# Patient Record
Sex: Female | Born: 1965 | Race: Black or African American | Hispanic: No | Marital: Married | Smoking: Never smoker
Health system: Southern US, Community
[De-identification: ages and names within clinical notes are randomized; demographics above are authoritative.]

## PROBLEM LIST (undated history)

## (undated) DIAGNOSIS — I10 Essential (primary) hypertension: Secondary | ICD-10-CM

## (undated) DIAGNOSIS — R600 Localized edema: Secondary | ICD-10-CM

## (undated) DIAGNOSIS — G4730 Sleep apnea, unspecified: Secondary | ICD-10-CM

## (undated) DIAGNOSIS — K529 Noninfective gastroenteritis and colitis, unspecified: Secondary | ICD-10-CM

## (undated) DIAGNOSIS — E559 Vitamin D deficiency, unspecified: Secondary | ICD-10-CM

## (undated) DIAGNOSIS — D869 Sarcoidosis, unspecified: Secondary | ICD-10-CM

## (undated) DIAGNOSIS — M25569 Pain in unspecified knee: Secondary | ICD-10-CM

## (undated) HISTORY — PX: MYOMECTOMY: SHX85

## (undated) HISTORY — DX: Sleep apnea, unspecified: G47.30

## (undated) HISTORY — PX: APPENDECTOMY: SHX54

## (undated) HISTORY — DX: Vitamin D deficiency, unspecified: E55.9

## (undated) HISTORY — DX: Pain in unspecified knee: M25.569

## (undated) HISTORY — DX: Localized edema: R60.0

## (undated) HISTORY — DX: Sarcoidosis, unspecified: D86.9

---

## 2005-02-26 DIAGNOSIS — I319 Disease of pericardium, unspecified: Secondary | ICD-10-CM

## 2005-02-26 HISTORY — DX: Disease of pericardium, unspecified: I31.9

## 2014-02-12 ENCOUNTER — Emergency Department (HOSPITAL_BASED_OUTPATIENT_CLINIC_OR_DEPARTMENT_OTHER)
Admission: EM | Admit: 2014-02-12 | Discharge: 2014-02-12 | Disposition: A | Discharge status: Home / Self Care | Payer: BC Managed Care – PPO | Attending: Emergency Medicine | Admitting: Emergency Medicine

## 2014-02-12 ENCOUNTER — Encounter (HOSPITAL_BASED_OUTPATIENT_CLINIC_OR_DEPARTMENT_OTHER): Payer: Self-pay | Admitting: *Deleted

## 2014-02-12 DIAGNOSIS — R109 Unspecified abdominal pain: Secondary | ICD-10-CM | POA: Diagnosis present

## 2014-02-12 DIAGNOSIS — Z3202 Encounter for pregnancy test, result negative: Secondary | ICD-10-CM | POA: Insufficient documentation

## 2014-02-12 DIAGNOSIS — Z9089 Acquired absence of other organs: Secondary | ICD-10-CM | POA: Insufficient documentation

## 2014-02-12 DIAGNOSIS — Z79899 Other long term (current) drug therapy: Secondary | ICD-10-CM | POA: Insufficient documentation

## 2014-02-12 DIAGNOSIS — K2970 Gastritis, unspecified, without bleeding: Secondary | ICD-10-CM | POA: Insufficient documentation

## 2014-02-12 DIAGNOSIS — I10 Essential (primary) hypertension: Secondary | ICD-10-CM | POA: Diagnosis not present

## 2014-02-12 HISTORY — DX: Essential (primary) hypertension: I10

## 2014-02-12 HISTORY — DX: Noninfective gastroenteritis and colitis, unspecified: K52.9

## 2014-02-12 LAB — URINALYSIS, ROUTINE W REFLEX MICROSCOPIC
Bilirubin Urine: NEGATIVE
Glucose, UA: NEGATIVE mg/dL
Hgb urine dipstick: NEGATIVE
Ketones, ur: NEGATIVE mg/dL
LEUKOCYTES UA: NEGATIVE
Nitrite: NEGATIVE
PH: 5.5 (ref 5.0–8.0)
Protein, ur: NEGATIVE mg/dL
SPECIFIC GRAVITY, URINE: 1.017 (ref 1.005–1.030)
Urobilinogen, UA: 0.2 mg/dL (ref 0.0–1.0)

## 2014-02-12 LAB — COMPREHENSIVE METABOLIC PANEL
ALBUMIN: 3.5 g/dL (ref 3.5–5.2)
ALT: 12 U/L (ref 0–35)
AST: 14 U/L (ref 0–37)
Alkaline Phosphatase: 57 U/L (ref 39–117)
Anion gap: 13 (ref 5–15)
BUN: 9 mg/dL (ref 6–23)
CALCIUM: 9.5 mg/dL (ref 8.4–10.5)
CHLORIDE: 105 meq/L (ref 96–112)
CO2: 24 mEq/L (ref 19–32)
CREATININE: 0.8 mg/dL (ref 0.50–1.10)
GFR calc Af Amer: >90 mL/min (ref >90)
GFR calc non Af Amer: 87 mL/min — ABNORMAL LOW (ref >90)
Glucose, Bld: 100 mg/dL — ABNORMAL HIGH (ref 70–99)
Potassium: 4.1 mEq/L (ref 3.7–5.3)
Sodium: 142 mEq/L (ref 137–147)
Total Bilirubin: 0.5 mg/dL (ref 0.3–1.2)
Total Protein: 7.8 g/dL (ref 6.0–8.3)

## 2014-02-12 LAB — CBC WITH DIFFERENTIAL/PLATELET
BASOS PCT: 0 % (ref 0–1)
Basophils Absolute: 0 10*3/uL (ref 0.0–0.1)
EOS PCT: 5 % (ref 0–5)
Eosinophils Absolute: 0.5 10*3/uL (ref 0.0–0.7)
HEMATOCRIT: 38.7 % (ref 36.0–46.0)
Hemoglobin: 13.1 g/dL (ref 12.0–15.0)
Lymphocytes Relative: 22 % (ref 12–46)
Lymphs Abs: 2.4 10*3/uL (ref 0.7–4.0)
MCH: 30.4 pg (ref 26.0–34.0)
MCHC: 33.9 g/dL (ref 30.0–36.0)
MCV: 89.8 fL (ref 78.0–100.0)
MONO ABS: 0.6 10*3/uL (ref 0.1–1.0)
Monocytes Relative: 5 % (ref 3–12)
NEUTROS ABS: 7.4 10*3/uL (ref 1.7–7.7)
Neutrophils Relative %: 68 % (ref 43–77)
Platelets: 307 10*3/uL (ref 150–400)
RBC: 4.31 MIL/uL (ref 3.87–5.11)
RDW: 12.6 % (ref 11.5–15.5)
WBC: 10.9 10*3/uL — AB (ref 4.0–10.5)

## 2014-02-12 LAB — LIPASE, BLOOD: LIPASE: 25 U/L (ref 11–59)

## 2014-02-12 LAB — PREGNANCY, URINE: Preg Test, Ur: NEGATIVE

## 2014-02-12 MED ORDER — ONDANSETRON HCL 4 MG/2ML IJ SOLN
4.0000 mg | Freq: Once | INTRAMUSCULAR | Status: AC
  Administered 2014-02-12: 4 mg via INTRAVENOUS
  Filled 2014-02-12: qty 2

## 2014-02-12 MED ORDER — ESOMEPRAZOLE MAGNESIUM 40 MG PO CPDR: 40.0000 mg | DELAYED_RELEASE_CAPSULE | Freq: Every day | ORAL | Status: DC

## 2014-02-12 MED ORDER — SODIUM CHLORIDE 0.9 % IV BOLUS (SEPSIS)
1000.0000 mL | Freq: Once | INTRAVENOUS | Status: AC
  Administered 2014-02-12: 1000 mL via INTRAVENOUS

## 2014-02-12 MED ORDER — ONDANSETRON 4 MG PO TBDP: ORAL_TABLET | ORAL | Status: DC

## 2014-02-12 NOTE — Discharge Instructions (Signed)

## 2014-02-12 NOTE — ED Provider Notes (Signed)
CSN: [id number]     Arrival date & time 02/12/14  1455 History   First MD Initiated Contact with Patient 02/12/14 1515     Chief Complaint  Patient presents with  . Abdominal Pain     (Consider location/radiation/quality/duration/timing/severity/associated sxs/prior Treatment) HPI Comments: Patient presents with nausea. She states that she started taking Augmentin about a week ago for sinus infection. Shortly after she started that she developed nausea. The antibiotic was switched to doxycycline but the nausea is continued. She's also started having some epigastric discomfort. She denies any vomiting. She denies any diarrhea or constipation. She denies any blood in her stool. She denies he fevers or chills. She states her sinus congestion has improved. She denies any cough or chest congestion. She denies any urinary symptoms.  Patient is a 48 y.o. female presenting with abdominal pain.  Abdominal Pain Associated symptoms: nausea   Associated symptoms: no chest pain, no chills, no cough, no diarrhea, no fatigue, no fever, no hematuria, no shortness of breath and no vomiting     Past Medical History  Diagnosis Date  . Colitis   . Hypertension    Past Surgical History  Procedure Laterality Date  . Appendectomy    . Cesarean section    . Myomectomy     No family history on file. History  Substance Use Topics  . Smoking status: Never Smoker   . Smokeless tobacco: Not on file  . Alcohol Use: No   OB History    No data available     Review of Systems  Constitutional: Negative for fever, chills, diaphoresis and fatigue.  HENT: Negative for congestion, rhinorrhea and sneezing.   Eyes: Negative.   Respiratory: Negative for cough, chest tightness and shortness of breath.   Cardiovascular: Negative for chest pain and leg swelling.  Gastrointestinal: Positive for nausea and abdominal pain. Negative for vomiting, diarrhea and blood in stool.  Genitourinary: Negative for frequency,  hematuria, flank pain and difficulty urinating.  Musculoskeletal: Negative for back pain and arthralgias.  Skin: Negative for rash.  Neurological: Negative for dizziness, speech difficulty, weakness, numbness and headaches.      Allergies  Review of patient's allergies indicates no known allergies.  Home Medications   Prior to Admission medications   Medication Sig Start Date End Date Taking? Authorizing Provider  amLODipine-benazepril (LOTREL) 5-20 MG per capsule Take 1 capsule by mouth daily.   Yes Historical Provider, MD  esomeprazole (NEXIUM) 40 MG capsule Take 1 capsule (40 mg total) by mouth daily. 02/12/14   Rolan BuccoMelanie Riyan Haile, MD  ondansetron (ZOFRAN ODT) 4 MG disintegrating tablet 4mg  ODT q4 hours prn nausea/vomit 02/12/14   Rolan BuccoMelanie Ramir Malerba, MD   BP 156/93 mmHg  Pulse 67  Temp(Src) 99.3 F (37.4 C) (Oral)  Resp 16  Ht 5\' 1"  (1.549 m)  Wt 189 lb (85.73 kg)  BMI 35.73 kg/m2  SpO2 100% Physical Exam  Constitutional: She is oriented to person, place, and time. She appears well-developed and well-nourished.  HENT:  Head: Normocephalic and atraumatic.  Eyes: Pupils are equal, round, and reactive to light.  Neck: Normal range of motion. Neck supple.  Cardiovascular: Normal rate, regular rhythm and normal heart sounds.   Pulmonary/Chest: Effort normal and breath sounds normal. No respiratory distress. She has no wheezes. She has no rales. She exhibits no tenderness.  Abdominal: Soft. Bowel sounds are normal. There is tenderness (mild tenderness to epigastrium). There is no rebound and no guarding.  Musculoskeletal: Normal range of motion. She exhibits  no edema.  Lymphadenopathy:    She has no cervical adenopathy.  Neurological: She is alert and oriented to person, place, and time.  Skin: Skin is warm and dry. No rash noted.  Psychiatric: She has a normal mood and affect.    ED Course  Procedures (including critical care time) Labs Review Labs Reviewed  CBC WITH DIFFERENTIAL  - Abnormal; Notable for the following:    WBC 10.9 (*)    All other components within normal limits  COMPREHENSIVE METABOLIC PANEL - Abnormal; Notable for the following:    Glucose, Bld 100 (*)    GFR calc non Af Amer 87 (*)    All other components within normal limits  LIPASE, BLOOD  URINALYSIS, ROUTINE W REFLEX MICROSCOPIC  PREGNANCY, URINE    Imaging Review No results found.   EKG Interpretation None      MDM   Final diagnoses:  Gastritis    Patient is feeling much better after IV fluids and Zofran. She has no abdominal pain on exam. Her labs are unremarkable. She's currently stop the antibiotics. I gave her prescription for Nexium and Zofran. I feel her symptoms are likely related to gastritis. She was encouraged to follow-up with her primary care physician or return here for symptoms worsen or are not improving.    Rolan BuccoMelanie Malcom Selmer, MD 02/12/14 [phone]

## 2014-02-12 NOTE — ED Notes (Signed)
Attempted to obtain urine sample, patient unable to provide any at this time.Patient states she hasn't had much to drink today.

## 2014-02-12 NOTE — ED Notes (Signed)
Generalized abdominal pain since Monday. She was started on antibiotics for sinus infection last week. Nausea. She vomited x 2 last night.

## 2015-04-02 DIAGNOSIS — E785 Hyperlipidemia, unspecified: Secondary | ICD-10-CM | POA: Insufficient documentation

## 2015-07-04 ENCOUNTER — Telehealth (HOSPITAL_COMMUNITY): Payer: Self-pay | Admitting: Vascular Surgery

## 2015-07-04 NOTE — Telephone Encounter (Signed)
Left pt message make f/u appt w/ DB

## 2015-08-12 ENCOUNTER — Ambulatory Visit (HOSPITAL_COMMUNITY)
Admission: RE | Admit: 2015-08-12 | Discharge: 2015-08-12 | Disposition: A | Discharge status: Home / Self Care | Payer: BC Managed Care – PPO | Source: Ambulatory Visit | Attending: Internal Medicine | Admitting: Internal Medicine

## 2015-08-12 ENCOUNTER — Encounter (HOSPITAL_COMMUNITY): Payer: Self-pay | Admitting: *Deleted

## 2015-08-12 VITALS — BP 132/88 | HR 76 | Wt 177.0 lb

## 2015-08-12 DIAGNOSIS — I10 Essential (primary) hypertension: Secondary | ICD-10-CM | POA: Diagnosis not present

## 2015-08-12 DIAGNOSIS — Z885 Allergy status to narcotic agent status: Secondary | ICD-10-CM | POA: Diagnosis not present

## 2015-08-12 DIAGNOSIS — I517 Cardiomegaly: Secondary | ICD-10-CM

## 2015-08-12 DIAGNOSIS — N979 Female infertility, unspecified: Secondary | ICD-10-CM | POA: Diagnosis not present

## 2015-08-12 DIAGNOSIS — K5190 Ulcerative colitis, unspecified, without complications: Secondary | ICD-10-CM | POA: Insufficient documentation

## 2015-08-12 NOTE — Progress Notes (Signed)
Patient ID: Christina LarocheMelody M Poole, female   DOB: 04/28/1965, 50 y.o.   MRN: [id number]  CARDIOLOGY CONSULT NOTE  Referring Physician: Dr. Foster SimpsonPaul Marshburn [facility] [place] [phone] Primary Cardiologist: Dr. 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 Dalene SeltzerMashburn for cardiac clearance prior to embryo implantation and pregnancy.   Underwent successful IVF 6 years ago without problems except with worsening HTN at end (denies pre-eclampsia). Now considering a second implant.  Denies h/o known cardiac disease. 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 Very active no CP or SOB. BP well controlled in 130s. No HF symptoms.    Review of Systems:     Cardiac Review of Systems: {Y] = yes [ ]  = no  Chest Pain [    ]  Resting SOB [   ] Exertional SOB  [  ]  Orthopnea [  ]   Pedal Edema [   ]    Palpitations [  ] Syncope  [  ]   Presyncope [   ]  General Review of Systems: [Y] = yes [  ]=no Constitional: recent weight change [  ]; anorexia [  ]; fatigue [  ]; nausea [  ]; night sweats [  ]; fever [  ]; or chills [  ];                                                                     Eyes : blurred vision [  ]; diplopia [   ]; vision changes [  ];  Amaurosis fugax[  ]; Resp: cough [  ];  wheezing[  ];  hemoptysis[  ];  PND [  ];  GI:  gallstones[  ], vomiting[  ];  dysphagia[  ]; melena[  ];  hematochezia [  ]; heartburn[  ];   GU: kidney stones [  ]; hematuria[  ];   dysuria [  ];  nocturia[  ]; incontinence [  ];             Skin: rash, swelling[  ];, hair loss[  ];  peripheral edema[  ];  or itching[  ]; Musculosketetal: myalgias[  ];  joint swelling[  ];  joint erythema[  ];  joint pain[  ];  back pain[  ];  Heme/Lymph: bruising[  ];  bleeding[  ];  anemia[  ];  Neuro: TIA[  ];  headaches[  ];  stroke[  ];  vertigo[  ];  seizures[   ];   paresthesias[  ];  difficulty walking[  ];  Psych:depression[  ]; anxiety[  ];  Endocrine: diabetes[  ];  thyroid dysfunction[  ];  Other:  Past Medical History  Diagnosis Date  . Colitis   . Hypertension   . Asthenospermia   . Pelvic adhesions     Prior to Admission medications   Medication Sig Start Date End Date Taking? Authorizing Provider  amLODipine-benazepril (LOTREL) 5-20 MG per capsule Take 1 capsule by mouth daily.   Yes Historical Provider, MD  Biotin 1 MG CAPS Take 1 mg elemental calcium/kg/hr by mouth daily.   Yes  Historical Provider, MD  clobetasol cream (TEMOVATE) 0.05 % Apply 1 application topically 2 (two) times daily.   Yes Historical Provider, MD  inFLIXimab (REMICADE) 100 MG injection Inject 100 mg into the vein every 8 (eight) weeks.   Yes Historical Provider, MD  Multiple Vitamin (MULTIVITAMIN) tablet Take 1 tablet by mouth daily.   Yes Historical Provider, MD  Omega-3 Fatty Acids (FISH OIL) 1200 MG CPDR Take 1,200 mg by mouth daily.   Yes Historical Provider, MD     Allergies  Allergen Reactions  . Codeine     Severe unrelenting nausea    Social History   Social History  . Marital Status: Married    Spouse Name: N/A  . Number of Children: N/A  . Years of Education: N/A   Occupational History  . Not on file.   Social History Main Topics  . Smoking status: Never Smoker   . Smokeless tobacco: Not on file  . Alcohol Use: No  . Drug Use: No  . Sexual Activity: Not on file   Other Topics Concern  . Not on file   Social History Narrative    Family history:  M 18  Diabetic no heart disease F died in 87s from cancer No FHx of premature CAD or HF    PHYSICAL EXAM: Filed Vitals:   08/12/15 1151  BP: 132/88  Pulse: 76    No intake or output data in the 24 hours ending 08/12/15 1233  General:  Well appearing. No respiratory difficulty HEENT: normal Neck: supple. no JVD. Carotids 2+ bilat; no bruits. No lymphadenopathy or  thryomegaly appreciated. Cor: PMI nondisplaced. Regular rate & rhythm. No rubs, gallops or murmurs. Lungs: clear Abdomen: soft, nontender, nondistended. No hepatosplenomegaly. No bruits or masses. Good bowel sounds. Extremities: no cyanosis, clubbing, rash, edema Neuro: alert & oriented x 3, cranial nerves grossly intact. moves all 4 extremities w/o difficulty. Affect pleasant.  ECG:  NSR 73 +LVH No ST-T wave abnormalities.    ASSESSMENT: 1. HTN 2. LVH on ECG 3. Infertility 4. Ulcerative colitis  PLAN/DISCUSSION:  Given her activity level and lack of CV risk factors or symptoms, I think she is fine to proceed with pregnancy. Given LVH on ECG will get echo for completeness sake. She will need to change her anti-HTN medicine from Lotrel to Aldomet prior to conception (Norvasc alone probably safe, if needed but Aldomet first choice). Watch BP closely during pregnancy. She can f/u with me as needed.  Bensimhon, Daniel,MD 12:44 PM

## 2015-08-12 NOTE — Patient Instructions (Signed)
Your physician recommends that you schedule a follow-up appointment AS NEEDED.  Your physician has requested that you have an echocardiogram. Echocardiography is a painless test that uses sound waves to create images of your heart. It provides your doctor with information about the size and shape of your heart and how well your heart's chambers and valves are working. This procedure takes approximately one hour. There are no restrictions for this procedure.      

## 2015-08-12 NOTE — Progress Notes (Signed)
Advanced Heart Failure Medication Review by a Pharmacist  Does the patient  feel that his/her medications are working for him/her?  yes  Has the patient been experiencing any side effects to the medications prescribed?  no  Does the patient measure his/her own blood pressure or blood glucose at home?  no   Does the patient have any problems obtaining medications due to transportation or finances?   no  Understanding of regimen: good Understanding of indications: good Potential of compliance: good Patient understands to avoid NSAIDs. Patient understands to avoid decongestants.   Pharmacist comments: MS. Christina Poole is a pleasant [age] yof presenting to clinic as a new HF patient. She is not currently experiencing any medication-related side effects. She denies any current swelling, SOB, or dizziness. She is taking very few prescription medications at this time. She states that she sleeps well at night and rarely misses any doses of her medications. She did not have any medication related questions or concerns at this visit. Made sure to establish current pharmacy.  Zakai Gonyea C. Marvis MoellerMiles, PharmD Pharmacy Resident  Pager: [phone] 08/12/2015 12:24 PM   Time with patient: 10 min  Preparation and documentation time: 2 min  Total time: 12 min

## 2015-08-13 DIAGNOSIS — I10 Essential (primary) hypertension: Secondary | ICD-10-CM | POA: Insufficient documentation

## 2015-08-13 DIAGNOSIS — I517 Cardiomegaly: Secondary | ICD-10-CM | POA: Insufficient documentation

## 2015-08-15 ENCOUNTER — Telehealth (HOSPITAL_COMMUNITY): Payer: Self-pay | Admitting: Cardiology

## 2015-08-15 NOTE — Telephone Encounter (Signed)
As requested by Dr Gala RomneyBensimhon oV 08/12/15 faxed to Referring Physician: Dr. Foster SimpsonPaul Marshburn [facility] [place]) [place] [phone]

## 2015-08-15 NOTE — Telephone Encounter (Signed)
-----   Message from Dolores Pattyaniel R Bensimhon, MD sent at 08/13/2015 12:31 AM EDT ----- Please fax to number on clinic note. Thanks.

## 2015-08-23 ENCOUNTER — Ambulatory Visit (HOSPITAL_COMMUNITY)
Admission: RE | Admit: 2015-08-23 | Discharge: 2015-08-23 | Disposition: A | Discharge status: Home / Self Care | Payer: BC Managed Care – PPO | Source: Ambulatory Visit | Attending: Cardiology | Admitting: Cardiology

## 2015-08-23 DIAGNOSIS — I110 Hypertensive heart disease with heart failure: Secondary | ICD-10-CM | POA: Diagnosis not present

## 2015-08-23 DIAGNOSIS — I340 Nonrheumatic mitral (valve) insufficiency: Secondary | ICD-10-CM | POA: Diagnosis not present

## 2015-08-23 DIAGNOSIS — I10 Essential (primary) hypertension: Secondary | ICD-10-CM | POA: Diagnosis not present

## 2015-08-23 DIAGNOSIS — I509 Heart failure, unspecified: Secondary | ICD-10-CM | POA: Diagnosis present

## 2015-08-23 LAB — ECHOCARDIOGRAM COMPLETE
E decel time: 229 msec
E/e' ratio: 8.92
FS: 31 % (ref 28–44)
IV/PV OW: 1.17
LA diam end sys: 37 mm
LA vol index: 30.7 mL/m2
LA vol: 54.9 mL
LADIAMINDEX: 2.07 cm/m2
LASIZE: 37 mm
LAVOLA4C: 52.2 mL
LV E/e' medial: 8.92
LV E/e'average: 8.92
LV TDI E'LATERAL: 9.57
LV e' LATERAL: 9.57 cm/s
LVOT area: 2.27 cm2
LVOT diameter: 17 mm
MV Dec: 229
MV Peak grad: 3 mmHg
MV pk A vel: 73.7 m/s
MVPKEVEL: 85.4 m/s
PW: 10.1 mm — AB (ref 0.6–1.1)
Reg peak vel: 214 cm/s
TAPSE: 26.1 mm
TDI e' medial: 8.92
TRMAXVEL: 214 cm/s

## 2015-08-23 NOTE — Progress Notes (Signed)
  Echocardiogram 2D Echocardiogram has been performed.  Arvil ChacoFoster, Deniz Eskridge 08/23/2015, 11:03 AM

## 2015-09-05 ENCOUNTER — Telehealth (HOSPITAL_COMMUNITY): Payer: Self-pay | Admitting: *Deleted

## 2015-09-05 NOTE — Telephone Encounter (Signed)
Pt given echo results, results also faxed to Dr. Foster SimpsonPaul Marshburn [facility] [place]) Fax [phone] per her request

## 2015-09-05 NOTE — Telephone Encounter (Signed)
Attempted to call pt w/echo results, Left message to call back   

## 2015-09-30 ENCOUNTER — Ambulatory Visit (INDEPENDENT_AMBULATORY_CARE_PROVIDER_SITE_OTHER): Payer: BC Managed Care – PPO | Admitting: Internal Medicine

## 2015-09-30 ENCOUNTER — Other Ambulatory Visit: Payer: BC Managed Care – PPO

## 2015-09-30 ENCOUNTER — Encounter: Payer: Self-pay | Admitting: Internal Medicine

## 2015-09-30 VITALS — BP 122/68 | HR 91 | Ht 61.0 in | Wt 177.0 lb

## 2015-09-30 DIAGNOSIS — L52 Erythema nodosum: Secondary | ICD-10-CM

## 2015-09-30 DIAGNOSIS — Z79899 Other long term (current) drug therapy: Secondary | ICD-10-CM

## 2015-09-30 DIAGNOSIS — Z5181 Encounter for therapeutic drug level monitoring: Secondary | ICD-10-CM | POA: Diagnosis not present

## 2015-09-30 DIAGNOSIS — D869 Sarcoidosis, unspecified: Secondary | ICD-10-CM

## 2015-09-30 DIAGNOSIS — R9389 Abnormal findings on diagnostic imaging of other specified body structures: Secondary | ICD-10-CM

## 2015-09-30 DIAGNOSIS — R938 Abnormal findings on diagnostic imaging of other specified body structures: Secondary | ICD-10-CM | POA: Diagnosis not present

## 2015-09-30 LAB — ANGIOTENSIN CONVERTING ENZYME: Angiotensin-Converting Enzyme: 28 U/L (ref 8–52)

## 2015-09-30 NOTE — Patient Instructions (Addendum)
ICD-9-CM ICD-10-CM   1. Sarcoidosis (HCC) 135 D86.9   2. 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 Abnormal chest x-ray 793.2 R93.8   4. Encounter for monitoring immunomodulating therapy V58.83 Z51.81    V58.69 Z79.899     Do blood ACE level  Do HRCT chest  And then ct chest with contrast Do PFT  Follolwup  return to see me next few weeks but after completing above

## 2015-09-30 NOTE — Progress Notes (Signed)
Subjective:    Patient ID: Christina Poole, female    DOB: 10-Apr-1965, 50 y.o.   MRN: [id number]  PCP No PCP Per Patient Gastro Dr. Dulce Sellar Dermatologist Dr. Arlys John sSTrauss    HPI   IOV 09/30/2015  Chief Complaint  Patient presents with  . Advice Only    Referred for abn cxr, sarcoidosis.  cxr in Epic. Denies any breathing complaints currently.      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 Christina Poole to [place] area approximately 2 years ago. She has inflammatory bowel disease and is under the care of Dr. Chrissie Noa outlaw at [facility] and his been on Remicade for the last several years every 2 months. She has not had a single flareup and being on Remicade. A year ago she started noticing skin lesions in bilateral shin that did not resolve. Therefore a few months ago she saw Dr. Arlys John Straussdermatologist was subjected to biopsy which confirmed sarcoidosis. For the last 3 weeks, after failing topical therapy, she's been on prednisone 5 mg every other day and this has helped the skin lesions. She's not having any side effects from the prednisone. There is concern that she might have pulmonary sarcoidosis because on 08/23/2015 she had a cardiac echocardiogram that was normal and a chest x-ray that is reported as abnormal. I personally visualized this chest x-ray and noticed hilar adenopathy and possible interstitial infiltrates. She says that these tests were done as part of workup related to her frozen embryos she did not want to detail any further. She says at baseline she walks 3 miles every few days and is not symptomatic. She denies any chest pain, cough, dyspnea, hemoptysis, night sweats, chills.   Echocardiogram 08/23/2015: Normal ejection fraction 65%. Right-sided pressures are normal. Pulmonary artery pressure is reported as normal.  02/12/2014 blood work: Creatinine 0.8 mg percent hemoglobin 13 g percent.    has a past medical history of Asthenospermia; Colitis;  Hypertension; Pelvic adhesions; and Sarcoidosis (HCC).   reports that she has never smoked. She has never used smokeless tobacco.  Past Surgical History:  Procedure Laterality Date  . APPENDECTOMY    . CESAREAN SECTION    . MYOMECTOMY      Allergies  Allergen Reactions  . Codeine     Severe unrelenting nausea    Immunization History  Administered Date(s) Administered  . Influenza Split 12/31/2014    No family history on file.   Current Outpatient Prescriptions:  .  amLODipine-benazepril (LOTREL) 5-20 MG per capsule, Take 1 capsule by mouth daily., Disp: , Rfl:  .  Biotin 1 MG CAPS, Take 1 mg elemental calcium/kg/hr by mouth daily., Disp: , Rfl:  .  inFLIXimab (REMICADE) 100 MG injection, Inject 100 mg into the vein every 8 (eight) weeks., Disp: , Rfl:  .  Multiple Vitamin (MULTIVITAMIN) tablet, Take 1 tablet by mouth daily., Disp: , Rfl:  .  Omega-3 Fatty Acids (FISH OIL) 1200 MG CPDR, Take 1,200 mg by mouth daily., Disp: , Rfl:  .  predniSONE (DELTASONE) 5 MG tablet, Take 5 mg by mouth every other day., Disp: , Rfl:  .  clobetasol cream (TEMOVATE) 0.05 %, Apply 1 application topically 2 (two) times daily., Disp: , Rfl:          Review of Systems  Constitutional: Negative for fever and unexpected weight change.  HENT: Negative for congestion, dental problem, ear pain, nosebleeds, postnasal drip, rhinorrhea, sinus pressure, sneezing, sore throat and trouble swallowing.   Eyes: Negative for redness  and itching.  Respiratory: Negative for cough, chest tightness, shortness of breath and wheezing.   Cardiovascular: Negative for palpitations and leg swelling.  Gastrointestinal: Negative for nausea and vomiting.  Genitourinary: Negative for dysuria.  Musculoskeletal: Negative for joint swelling.  Skin: Negative for rash.  Neurological: Negative for headaches.  Hematological: Does not bruise/bleed easily.  Psychiatric/Behavioral: Negative for dysphoric mood. The patient  is not nervous/anxious.        Objective:   Physical Exam  Constitutional: She is oriented to person, place, and time. She appears well-developed and well-nourished. No distress.  HENT:  Head: Normocephalic and atraumatic.  Right Ear: External ear normal.  Left Ear: External ear normal.  Mouth/Throat: Oropharynx is clear and moist. No oropharyngeal exudate.  Eyes: Conjunctivae and EOM are normal. Pupils are equal, round, and reactive to light. Right eye exhibits no discharge. Left eye exhibits no discharge. No scleral icterus.  Neck: Normal range of motion. Neck supple. No JVD present. No tracheal deviation present. No thyromegaly present.  Cardiovascular: Normal rate, regular rhythm, normal heart sounds and intact distal pulses.  Exam reveals no gallop and no friction rub.   No murmur heard. Pulmonary/Chest: Effort normal and breath sounds normal. No respiratory distress. She has no wheezes. She has no rales. She exhibits no tenderness.  Abdominal: Soft. Bowel sounds are normal. She exhibits no distension and no mass. There is no tenderness. There is no rebound and no guarding.  Musculoskeletal: Normal range of motion. She exhibits no edema or tenderness.  Lymphadenopathy:    She has no cervical adenopathy.  Neurological: She is alert and oriented to person, place, and time. She has normal reflexes. No cranial nerve deficit. She exhibits normal muscle tone. Coordination normal.  Skin: Skin is warm and dry. No rash noted. She is not diaphoretic. No erythema. No pallor.  Bilateral multiple thickened erythema nodosum in her shin. No ulcers  Psychiatric: She has a normal mood and affect. Her behavior is normal. Judgment and thought content normal.  Vitals reviewed.   Vitals:   09/30/15 1014  BP: 122/68  Pulse: 91  SpO2: 97%  Weight: 177 lb (80.3 kg)  Height: 5\' 1"  (1.549 m)         Assessment & Plan:     ICD-9-CM ICD-10-CM   1. Sarcoidosis (HCC) 135 D86.9 Angiotensin  converting enzyme     CT CHEST HIGH RESOLUTION     Pulmonary Function Test     CT CHEST W CONTRAST  2. 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 Abnormal chest x-ray 793.2 R93.8   4. Encounter for monitoring immunomodulating therapy V58.83 Z51.81    V58.69 Z79.899      She is on immunosuppressive therapy TNF Alpha blockade. 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 She has chest x-ray suggestive of pulmonary sarcoidosis but she is asymptomatic. At this point we will do a blood angiotensin converting enzyme level, pulmonary function tests and CT scan of the chest. Given the fact she is asymptomatic for pulmonary function test is normal then we would just continue to watch her on 5 mg prednisone every other day. However if the pulmonary function tests is significantly abnormal I would consider increasing her prednisone dosage. Given the fact she is asymptomatic and is already on TNF blockade I do not think there is a role for a second agent such as methotrexate.  Of note if the CT scan shows anything abnormal to suggest opportunistic infection and she might need a bronchoscopy   I will expand this to her in detail. She verbalized understanding and agreement  She is to return to see me in a few weeks after testing   Dr. Kalman Shan, M.D., [facility].C.P Pulmonary and Critical Care Medicine Staff Physician [facility] Pager: [phone], If no answer or between  15:00h - 7:00h: call [phone]  09/30/2015 10:49 AM

## 2015-10-03 ENCOUNTER — Ambulatory Visit (INDEPENDENT_AMBULATORY_CARE_PROVIDER_SITE_OTHER): Payer: BC Managed Care – PPO | Admitting: Internal Medicine

## 2015-10-03 DIAGNOSIS — D869 Sarcoidosis, unspecified: Secondary | ICD-10-CM | POA: Diagnosis not present

## 2015-10-03 LAB — PULMONARY FUNCTION TEST
DL/VA % pred: 113 %
DL/VA: 4.99 ml/min/mmHg/L
DLCO COR % PRED: 100 %
DLCO COR: 20.3 ml/min/mmHg
DLCO UNC % PRED: 102 %
DLCO UNC: 20.67 ml/min/mmHg
FEF 25-75 POST: 1.92 L/s
FEF 25-75 PRE: 2.08 L/s
FEF2575-%Change-Post: -7 %
FEF2575-%PRED-POST: 83 %
FEF2575-%PRED-PRE: 90 %
FEV1-%CHANGE-POST: -1 %
FEV1-%PRED-POST: 101 %
FEV1-%Pred-Pre: 102 %
FEV1-Post: 2.12 L
FEV1-Pre: 2.15 L
FEV1FVC-%Change-Post: -2 %
FEV1FVC-%Pred-Pre: 96 %
FEV6-%CHANGE-POST: 1 %
FEV6-%PRED-POST: 108 %
FEV6-%Pred-Pre: 106 %
FEV6-POST: 2.73 L
FEV6-Pre: 2.68 L
FEV6FVC-%Change-Post: 1 %
FEV6FVC-%PRED-POST: 103 %
FEV6FVC-%Pred-Pre: 102 %
FVC-%Change-Post: 0 %
FVC-%Pred-Post: 105 %
FVC-%Pred-Pre: 104 %
FVC-Post: 2.74 L
FVC-Pre: 2.71 L
POST FEV1/FVC RATIO: 77 %
PRE FEV1/FVC RATIO: 79 %
Post FEV6/FVC ratio: 100 %
Pre FEV6/FVC Ratio: 99 %
RV % pred: 76 %
RV: 1.22 L
TLC % pred: 88 %
TLC: 4.08 L

## 2015-10-03 NOTE — Progress Notes (Signed)
PFT performed today. 

## 2015-10-03 NOTE — Progress Notes (Signed)
LMOMTCB x 1 

## 2015-10-04 ENCOUNTER — Telehealth: Payer: Self-pay | Admitting: Internal Medicine

## 2015-10-04 ENCOUNTER — Ambulatory Visit (INDEPENDENT_AMBULATORY_CARE_PROVIDER_SITE_OTHER)
Admission: RE | Admit: 2015-10-04 | Discharge: 2015-10-04 | Disposition: A | Discharge status: Home / Self Care | Payer: BC Managed Care – PPO | Source: Ambulatory Visit | Attending: Internal Medicine | Admitting: Internal Medicine

## 2015-10-04 DIAGNOSIS — D869 Sarcoidosis, unspecified: Secondary | ICD-10-CM | POA: Diagnosis not present

## 2015-10-04 NOTE — Telephone Encounter (Signed)
Pt has been approved for HRCT chest without contrast and is scheduled for 10/04/2015 at 10:30 am. However, pt's insurance is needing a peer to peer for the CT chest with contrast.   Number to call: [phone] option 1, then option 1, then option 2.  MR please advise.

## 2015-10-05 NOTE — Telephone Encounter (Signed)
Patient notified of Dr. Jane Canaryamaswamy's recommendations. Patient would like to speak with Dr. Marchelle Gearingamaswamy before scheduling a Bronch.    Dr. Marchelle Gearingamaswamy, please contact patient at [phone] Thanks.

## 2015-10-05 NOTE — Telephone Encounter (Signed)
Baed on HRCT results -0 cancel CT chest with contrast  Let patient know that CT findings are c/w sarcoidpsis esp with age 2140s . However, given fact she has been on remicade for years probably best we do a bronchoscopy with lavage and bx. I can call her to go over it  Thanks  Dr. Kalman ShanMurali Christina Poole, M.D., [facility] Pulmonary and Critical Care Medicine Staff Physician [facility] [place] [facility] Pager: [phone], If no answer or between  15:00h - 7:00h: call [phone]  10/05/2015 2:30 AM      Ct Chest High Resolution  Result Date: 10/04/2015 CLINICAL DATA:  50 year old female with sarcoidosis (Reportedly biopsy-proven on lower extremity lesion biopsy). EXAM: CT CHEST WITHOUT CONTRAST TECHNIQUE: Multidetector CT imaging of the chest was performed following the standard protocol without intravenous contrast. High resolution imaging of the lungs, as well as inspiratory and expiratory imaging, was performed. COMPARISON:  None. FINDINGS: Mediastinum/Nodes: Normal heart size. No significant pericardial fluid/thickening. Great vessels are normal in course and caliber. No discrete thyroid nodules. Unremarkable esophagus. No axillary adenopathy. Mildly enlarged left prevascular lymph nodes measuring up to the 1.1 cm (series 4/ image 51). Mildly enlarged right paratracheal nodes measuring up to 1.4 cm (series 4/ image 51). Enlarged 1.6 cm subcarinal node (series 4/ image 63). Mild bilateral hilar lymphadenopathy, poorly delineated on this noncontrast CT. Lungs/Pleura: No pneumothorax. No pleural effusion. There is moderate patchy perilymphatic distribution nodularity throughout both lungs predominantly involving the mid to upper lungs, predominantly involving the peripheral peribronchovascular interstitium. There is mild perifissural nodularity along the bilateral major fissures. There are dominant conglomerate regions of nodularity in the subpleural basilar right upper lobe  measuring 2.8 x 1.4 cm (series 5/ image 64) and in the subpleural peripheral left upper lobe measuring 1.9 x 1.8 cm (series 5/image 80). No acute consolidative airspace disease. No significant regions of subpleural reticulation, traction bronchiectasis, ground-glass attenuation, parenchymal banding, architectural distortion or frank honeycombing. There is mild patchy air trapping in both lungs on the expiration sequence. Upper abdomen: Small hiatal hernia. Musculoskeletal: No aggressive appearing focal osseous lesions. Minimal thoracic spondylosis. IMPRESSION: 1. Interstitial lung disease characterized by moderate patchy perilymphatic distribution nodularity predominantly involving the mid to upper lungs, consistent with pulmonary sarcoidosis. 2. Mild to moderate mediastinal and mild bilateral hilar lymphadenopathy, consistent with sarcoidosis. 3. Mild patchy air trapping in both lungs, indicating small airways disease. 4. Small hiatal hernia. Electronically Signed   By: Delbert PhenixJason A Poff M.D.   On: 10/04/2015 13:07

## 2015-10-06 ENCOUNTER — Inpatient Hospital Stay: Admission: RE | Admit: 2015-10-06 | Payer: BC Managed Care – PPO | Source: Ambulatory Visit

## 2015-10-18 ENCOUNTER — Ambulatory Visit: Payer: BC Managed Care – PPO | Admitting: Internal Medicine

## 2015-10-20 NOTE — Telephone Encounter (Signed)
MR please advise if you've spoken to this patient about this.  Thanks!

## 2015-10-27 NOTE — Telephone Encounter (Signed)
Spoke with the pt and advised of recs per MR to come in 3 months after 8/31 and cancel appt 9/14  She verbalized understanding  She did not have her calendar and states will need to call us back about rov with PFT  I cancelled 11/10/15 appt

## 2015-10-27 NOTE — Telephone Encounter (Signed)
D/w patient:  - CT findings are typical of sarcoid - - Ddx is opportunistic infection due to TNFalpha blockade. she is asymptomatic and continues to be so. PFT normal  Based on above  - discussed monuitoring v bronch/bal/tbbx (low yield);' she opted to monitor without bronch  Plan  - cancel mid sept 2017 ov - change fu to 3 months from 10/27/2015  - get spiro and dlco (no lung volume, no bd response) in 3 months prior to next OV  Thanks  Dr. Kalman ShanMurali Darcel Frane, M.D., [facility] Pulmonary and Critical Care Medicine Staff Physician [facility] [place] [facility] Pager: [phone], If no answer or between  15:00h - 7:00h: call [phone]  10/27/2015 3:47 PM

## 2015-11-10 ENCOUNTER — Ambulatory Visit: Payer: BC Managed Care – PPO | Admitting: Internal Medicine

## 2016-07-16 ENCOUNTER — Telehealth: Payer: Self-pay | Admitting: Internal Medicine

## 2016-07-16 NOTE — Telephone Encounter (Signed)
Spoke with pt, who has requested that MR contact her current pulmonologist in [place], Dr. Sherryll BurgerShah. Dr. Margaretmary EddyShah's contact number is [phone].  Pt has already contact medical records for PFT & CT results. Pt would like to know if her breathing has worsen or is improving.  MR please advise. Thanks.

## 2016-07-18 NOTE — Telephone Encounter (Signed)
Ok it went to a call cnter and they are like "there are many Dr Fara BorosShahs In many location". So, please find out which Dr Sherryll BurgerShah it is and give him/her my number (cell) and I can talk  Dr. Kalman ShanMurali Renita Brocks, M.D., [facility] Pulmonary and Critical Care Medicine Staff Physician [facility] [place] [facility] Pager: [phone], If no answer or between  15:00h - 7:00h: call [phone]  07/18/2016 3:48 PM

## 2016-07-18 NOTE — Telephone Encounter (Signed)
Spoke with pt. Who states it is Dr. Erasmo DownerJoyti Shah. Pt states she thinks that Dr. Sherryll BurgerShah has received CT & PFT results from medical records, as Dr. Margaretmary EddyShah's office contacted pt today for an apt. Pt states she is going to contact Dr. Margaretmary EddyShah's office to confirm and will call back if MR still needs to reach out to Dr. Sherryll BurgerShah.

## 2016-09-07 ENCOUNTER — Other Ambulatory Visit (INDEPENDENT_AMBULATORY_CARE_PROVIDER_SITE_OTHER): Payer: BLUE CROSS/BLUE SHIELD

## 2016-09-07 ENCOUNTER — Encounter: Payer: Self-pay | Admitting: Adult Health

## 2016-09-07 ENCOUNTER — Ambulatory Visit (INDEPENDENT_AMBULATORY_CARE_PROVIDER_SITE_OTHER): Payer: BLUE CROSS/BLUE SHIELD | Admitting: Adult Health

## 2016-09-07 VITALS — BP 126/80 | HR 81 | Ht 61.0 in | Wt 191.0 lb

## 2016-09-07 DIAGNOSIS — D869 Sarcoidosis, unspecified: Secondary | ICD-10-CM

## 2016-09-07 LAB — HEPATIC FUNCTION PANEL
ALBUMIN: 3.8 g/dL (ref 3.5–5.2)
ALT: 16 U/L (ref 0–35)
AST: 15 U/L (ref 0–37)
Alkaline Phosphatase: 75 U/L (ref 39–117)
Bilirubin, Direct: 0 mg/dL (ref 0.0–0.3)
Total Bilirubin: 0.4 mg/dL (ref 0.2–1.2)
Total Protein: 7.3 g/dL (ref 6.0–8.3)

## 2016-09-07 LAB — BASIC METABOLIC PANEL
BUN: 17 mg/dL (ref 6–23)
CO2: 25 mEq/L (ref 19–32)
Calcium: 9.7 mg/dL (ref 8.4–10.5)
Chloride: 107 mEq/L (ref 96–112)
Creatinine, Ser: 0.99 mg/dL (ref 0.40–1.20)
GFR: 76.51 mL/min (ref >60.00)
Glucose, Bld: 106 mg/dL — ABNORMAL HIGH (ref 70–99)
Potassium: 4.2 mEq/L (ref 3.5–5.1)
Sodium: 140 mEq/L (ref 135–145)

## 2016-09-07 LAB — CBC WITH DIFFERENTIAL/PLATELET
Basophils Absolute: 0.1 10*3/uL (ref 0.0–0.1)
Basophils Relative: 0.7 % (ref 0.0–3.0)
Eosinophils Absolute: 0.2 10*3/uL (ref 0.0–0.7)
Eosinophils Relative: 1.3 % (ref 0.0–5.0)
HCT: 36.2 % (ref 36.0–46.0)
Hemoglobin: 11.9 g/dL — ABNORMAL LOW (ref 12.0–15.0)
LYMPHS PCT: 8.3 % — AB (ref 12.0–46.0)
Lymphs Abs: 1.2 10*3/uL (ref 0.7–4.0)
MCHC: 33 g/dL (ref 30.0–36.0)
MCV: 92.8 fl (ref 78.0–100.0)
MONOS PCT: 5 % (ref 3.0–12.0)
Monocytes Absolute: 0.7 10*3/uL (ref 0.1–1.0)
NEUTROS ABS: 11.9 10*3/uL — AB (ref 1.4–7.7)
Neutrophils Relative %: 84.7 % — ABNORMAL HIGH (ref 43.0–77.0)
Platelets: 357 10*3/uL (ref 150.0–400.0)
RBC: 3.9 Mil/uL (ref 3.87–5.11)
RDW: 15.8 % — ABNORMAL HIGH (ref 11.5–15.5)
WBC: 14.1 10*3/uL — ABNORMAL HIGH (ref 4.0–10.5)

## 2016-09-07 MED ORDER — METHOTREXATE SODIUM 2.5 MG PO TABS: ORAL_TABLET | ORAL | 5 refills | Status: DC

## 2016-09-07 NOTE — Patient Instructions (Signed)
Continue methotrexate weekly .  Continue on prednisone 10mg  daily for 3 weeks then alternate 10mg  .and 5mg  daily for 2 weeks. Then 5mg  daily and hold at this dose.  Labs today . Low sweet diet .  Establish with primary MD .  Follow up Dr. Marchelle Gearingamaswamy in 6 weeks and As needed   Please contact office for sooner follow up if symptoms do not improve or worsen or seek emergency care

## 2016-09-07 NOTE — Progress Notes (Signed)
@Patient  ID: Christina Poole, female    DOB: 20-Oct-1965, 51 y.o.   MRN: [id number]  Chief Complaint  Patient presents with  . Follow-up    [place]     Referring provider: No ref. provider found  HPI: 51 year old female never smoker followed for sarcoidosis (skin bx 2017 )  Hx of IBD -[facility] on Remicade   TEST  Echocardiogram 08/23/2015: Normal ejection fraction 65%. Right-sided pressures are normal. Pulmonary artery pressure is reported as normal. High resolution CT chest 10/04/2015 showed patchy nodularity involving the mid to upper lungs consistent with pulmonary sarcoidosis, interstitial lung disease., Mild to moderate mediastinal and bilateral hilar lymphadenopathy. Primary function test 10/03/2015 showed normal lung function with FEV1 at 101%, ratio 77, FVC 105%, no significant bronchodilator response, DLCO 102% ACE 28 09/2015    09/07/2016 Follow up : Sarcoid  Pt returns for 9 month follow up for Sarcoid  Moved to [place] briefly and seen by Pulmonary . She is getting copy of CT chest that was done.  Remains on Methotrexate (6 tabs /wk)  And Prednisone 10mg  daily  . She has skin and pulmonary involvement . Her Leg lesions are better but not gone.  No cough or dyspnea, hemoptysis .   Reports Calcium level was elevated and was given steroid burst over last month , now on 10mg  daily.   IBD is well controlled, has been taken off Remicade currently .    Allergies  Allergen Reactions  . Codeine     Severe unrelenting nausea    Immunization History  Administered Date(s) Administered  . Influenza Split 12/31/2014  . Influenza-Unspecified 12/03/2015    Past Medical History:  Diagnosis Date  . Asthenospermia   . Colitis   . Hypertension   . Pelvic adhesions   . Sarcoidosis     Tobacco History: History  Smoking Status  . Never Smoker  Smokeless Tobacco  . Never Used   Counseling given: Not Answered   Outpatient Encounter Prescriptions as of 09/07/2016    Medication Sig  . amLODipine-benazepril (LOTREL) 5-20 MG per capsule Take 1 capsule by mouth daily.  . Biotin 1 MG CAPS Take 1 mg elemental calcium/kg/hr by mouth daily.  . Cholecalciferol (VITAMIN D3) 2000 units TABS Take 1 capsule by mouth.  . inFLIXimab (REMICADE) 100 MG injection Inject 100 mg into the vein every 8 (eight) weeks.  . methotrexate 2.5 MG tablet Take 6 tabs every Friday  . Multiple Vitamins-Minerals (HAIR/SKIN/NAILS) CAPS Take 2 capsules by mouth daily.  . predniSONE (DELTASONE) 10 MG tablet Take 10 mg by mouth daily with breakfast.  . [DISCONTINUED] methotrexate 2.5 MG tablet Take 2.5 mg by mouth once a week. Take 6 tabs every Friday (Pt currently out of medicine)  . clobetasol cream (TEMOVATE) 0.05 % Apply 1 application topically 2 (two) times daily.  . [DISCONTINUED] Multiple Vitamin (MULTIVITAMIN) tablet Take 1 tablet by mouth daily.  . [DISCONTINUED] Omega-3 Fatty Acids (FISH OIL) 1200 MG CPDR Take 1,200 mg by mouth daily.  . [DISCONTINUED] predniSONE (DELTASONE) 5 MG tablet Take 5 mg by mouth every other day.   No facility-administered encounter medications on file as of 09/07/2016.      Review of Systems  Constitutional:   No  weight loss, night sweats,  Fevers, chills, fatigue, or  lassitude.  HEENT:   No headaches,  Difficulty swallowing,  Tooth/dental problems, or  Sore throat,                No sneezing,  itching, ear ache, nasal congestion, post nasal drip,   CV:  No chest pain,  Orthopnea, PND, swelling in lower extremities, anasarca, dizziness, palpitations, syncope.   GI  No heartburn, indigestion, abdominal pain, nausea, vomiting, diarrhea, change in bowel habits, loss of appetite, bloody stools.   Resp:    No chest wall deformity  Skin:  + skin lesions   GU: no dysuria, change in color of urine, no urgency or frequency.  No flank pain, no hematuria   MS:  No joint pain or swelling.  No decreased range of motion.  No back pain.    Physical  Exam  BP 126/80 (BP Location: Right Arm, Patient Position: Sitting, Cuff Size: Normal)   Pulse 81   Ht 5\' 1"  (1.549 m)   Wt 191 lb (86.6 kg)   SpO2 98%   BMI 36.09 kg/m   GEN: A/Ox3; pleasant , NAD,  Obese    HEENT:  [place]/AT,  EACs-clear, TMs-wnl, NOSE-clear, THROAT-clear, no lesions, no postnasal drip or exudate noted.   NECK:  Supple w/ fair ROM; no JVD; normal carotid impulses w/o bruits; no thyromegaly or nodules palpated; no lymphadenopathy.    RESP  Clear  P & A; w/o, wheezes/ rales/ or rhonchi. no accessory muscle use, no dullness to percussion  CARD:  RRR, no m/r/g, no peripheral edema, pulses intact, no cyanosis or clubbing.  GI:   Soft & nt; nml bowel sounds; no organomegaly or masses detected.   Musco: Warm bil, no deformities or joint swelling noted.   Neuro: alert, no focal deficits noted.    Skin: Warm, scattered macular lesions along lower legs.     Lab Results:  CBC    Component Value Date/Time   WBC 10.9 (H) 02/12/2014 1540   RBC 4.31 02/12/2014 1540   HGB 13.1 02/12/2014 1540   HCT 38.7 02/12/2014 1540   PLT 307 02/12/2014 1540   MCV 89.8 02/12/2014 1540   MCH 30.4 02/12/2014 1540   MCHC 33.9 02/12/2014 1540   RDW 12.6 02/12/2014 1540   LYMPHSABS 2.4 02/12/2014 1540   MONOABS 0.6 02/12/2014 1540   EOSABS 0.5 02/12/2014 1540   BASOSABS 0.0 02/12/2014 1540    BMET    Component Value Date/Time   NA 142 02/12/2014 1540   K 4.1 02/12/2014 1540   CL 105 02/12/2014 1540   CO2 24 02/12/2014 1540   GLUCOSE 100 (H) 02/12/2014 1540   BUN 9 02/12/2014 1540   CREATININE 0.80 02/12/2014 1540   CALCIUM 9.5 02/12/2014 1540   GFRNONAA 87 (L) 02/12/2014 1540   GFRAA >90 02/12/2014 1540    BNP No results found for: BNP  ProBNP No results found for: PROBNP  Imaging: No results found.   Assessment & Plan:   Sarcoidosis Appears stable , would like to lower prednisone as able  Steroid pt education  Recent hypercalcemia reported by pt , ?sarcoid  releated.  Will check level and taper steroids as able   Plan  Patient Instructions  Continue methotrexate weekly .  Continue on prednisone 10mg  daily for 3 weeks then alternate 10mg  .and 5mg  daily for 2 weeks. Then 5mg  daily and hold at this dose.  Labs today . Low sweet diet .  Establish with primary MD .  Follow up Dr. Marchelle Gearingamaswamy in 6 weeks and As needed   Please contact office for sooner follow up if symptoms do not improve or worsen or seek emergency care      Hypercalcemia Recent reported elevated levels  on steroid burst  Records requested   Plan  Check bmet  Patient Instructions  Continue methotrexate weekly .  Continue on prednisone 10mg  daily for 3 weeks then alternate 10mg  .and 5mg  daily for 2 weeks. Then 5mg  daily and hold at this dose.  Labs today . Low sweet diet .  Establish with primary MD .  Follow up Dr. Marchelle Gearing in 6 weeks and As needed   Please contact office for sooner follow up if symptoms do not improve or worsen or seek emergency care         Rubye Oaks, NP 09/07/2016

## 2016-09-07 NOTE — Progress Notes (Signed)
Left message for pt to call us back x1 

## 2016-09-07 NOTE — Assessment & Plan Note (Signed)
Recent reported elevated levels on steroid burst  Records requested   Plan  Check bmet  Patient Instructions  Continue methotrexate weekly .  Continue on prednisone 10mg  daily for 3 weeks then alternate 10mg  .and 5mg  daily for 2 weeks. Then 5mg  daily and hold at this dose.  Labs today . Low sweet diet .  Establish with primary MD .  Follow up Dr. Marchelle Gearingamaswamy in 6 weeks and As needed   Please contact office for sooner follow up if symptoms do not improve or worsen or seek emergency care

## 2016-09-07 NOTE — Assessment & Plan Note (Signed)
Appears stable , would like to lower prednisone as able  Steroid pt education  Recent hypercalcemia reported by pt , ?sarcoid releated.  Will check level and taper steroids as able   Plan  Patient Instructions  Continue methotrexate weekly .  Continue on prednisone 10mg  daily for 3 weeks then alternate 10mg  .and 5mg  daily for 2 weeks. Then 5mg  daily and hold at this dose.  Labs today . Low sweet diet .  Establish with primary MD .  Follow up Dr. Marchelle Gearingamaswamy in 6 weeks and As needed   Please contact office for sooner follow up if symptoms do not improve or worsen or seek emergency care

## 2016-09-10 NOTE — Progress Notes (Signed)
Spoke with patient and informed her of results. She was also informed to contact the office if she needed anything. Pt verbalized understanding. Nothing further is needed.

## 2016-09-17 ENCOUNTER — Ambulatory Visit (INDEPENDENT_AMBULATORY_CARE_PROVIDER_SITE_OTHER): Payer: BLUE CROSS/BLUE SHIELD | Admitting: Physician Assistant

## 2016-09-17 ENCOUNTER — Encounter: Payer: Self-pay | Admitting: Physician Assistant

## 2016-09-17 VITALS — BP 120/80 | HR 82 | Temp 98.2°F | Ht 61.0 in | Wt 193.0 lb

## 2016-09-17 DIAGNOSIS — I10 Essential (primary) hypertension: Secondary | ICD-10-CM | POA: Diagnosis not present

## 2016-09-17 DIAGNOSIS — Z13220 Encounter for screening for lipoid disorders: Secondary | ICD-10-CM | POA: Diagnosis not present

## 2016-09-17 DIAGNOSIS — G4733 Obstructive sleep apnea (adult) (pediatric): Secondary | ICD-10-CM

## 2016-09-17 DIAGNOSIS — Z136 Encounter for screening for cardiovascular disorders: Secondary | ICD-10-CM

## 2016-09-17 DIAGNOSIS — Z0000 Encounter for general adult medical examination without abnormal findings: Secondary | ICD-10-CM

## 2016-09-17 DIAGNOSIS — D869 Sarcoidosis, unspecified: Secondary | ICD-10-CM | POA: Diagnosis not present

## 2016-09-17 DIAGNOSIS — K529 Noninfective gastroenteritis and colitis, unspecified: Secondary | ICD-10-CM | POA: Diagnosis not present

## 2016-09-17 NOTE — Patient Instructions (Signed)
It was great to meet you!  Please make an appointment with the lab on your way out. I would like for you to return for lab work within 1 week. After midnight on the day of the lab draw, please do not eat anything. You may have water, black coffee, unsweetened tea.  Health Maintenance, Female Adopting a healthy lifestyle and getting preventive care can go a long way to promote health and wellness. Talk with your health care provider about what schedule of regular examinations is right for you. This is a good chance for you to check in with your provider about disease prevention and staying healthy. In between checkups, there are plenty of things you can do on your own. Experts have done a lot of research about which lifestyle changes and preventive measures are most likely to keep you healthy. Ask your health care provider for more information. Weight and diet Eat a healthy diet  Be sure to include plenty of vegetables, fruits, low-fat dairy products, and lean protein.  Do not eat a lot of foods high in solid fats, added sugars, or salt.  Get regular exercise. This is one of the most important things you can do for your health. ? Most adults should exercise for at least 150 minutes each week. The exercise should increase your heart rate and make you sweat (moderate-intensity exercise). ? Most adults should also do strengthening exercises at least twice a week. This is in addition to the moderate-intensity exercise.  Maintain a healthy weight  Body mass index (BMI) is a measurement that can be used to identify possible weight problems. It estimates body fat based on height and weight. Your health care provider can help determine your BMI and help you achieve or maintain a healthy weight.  For females 47 years of age and older: ? A BMI below 18.5 is considered underweight. ? A BMI of 18.5 to 24.9 is normal. ? A BMI of 25 to 29.9 is considered overweight. ? A BMI of 30 and above is considered  obese.  Watch levels of cholesterol and blood lipids  You should start having your blood tested for lipids and cholesterol at 49 years of age, then have this test every 5 years.  You may need to have your cholesterol levels checked more often if: ? Your lipid or cholesterol levels are high. ? You are older than 49 years of age. ? You are at high risk for heart disease.  Cancer screening Lung Cancer  Lung cancer screening is recommended for adults 42-22 years old who are at high risk for lung cancer because of a history of smoking.  A yearly low-dose CT scan of the lungs is recommended for people who: ? Currently smoke. ? Have quit within the past 15 years. ? Have at least a 30-pack-year history of smoking. A pack year is smoking an average of one pack of cigarettes a day for 1 year.  Yearly screening should continue until it has been 15 years since you quit.  Yearly screening should stop if you develop a health problem that would prevent you from having lung cancer treatment.  Breast Cancer  Practice breast self-awareness. This means understanding how your breasts normally appear and feel.  It also means doing regular breast self-exams. Let your health care provider know about any changes, no matter how small.  If you are in your 20s or 30s, you should have a clinical breast exam (CBE) by a health care provider every 1-3 years  as part of a regular health exam.  If you are 54 or older, have a CBE every year. Also consider having a breast X-ray (mammogram) every year.  If you have a family history of breast cancer, talk to your health care provider about genetic screening.  If you are at high risk for breast cancer, talk to your health care provider about having an MRI and a mammogram every year.  Breast cancer gene (BRCA) assessment is recommended for women who have family members with BRCA-related cancers. BRCA-related cancers  include: ? Breast. ? Ovarian. ? Tubal. ? Peritoneal cancers.  Results of the assessment will determine the need for genetic counseling and BRCA1 and BRCA2 testing.  Cervical Cancer Your health care provider may recommend that you be screened regularly for cancer of the pelvic organs (ovaries, uterus, and vagina). This screening involves a pelvic examination, including checking for microscopic changes to the surface of your cervix (Pap test). You may be encouraged to have this screening done every 3 years, beginning at age 69.  For women ages 40-65, health care providers may recommend pelvic exams and Pap testing every 3 years, or they may recommend the Pap and pelvic exam, combined with testing for human papilloma virus (HPV), every 5 years. Some types of HPV increase your risk of cervical cancer. Testing for HPV may also be done on women of any age with unclear Pap test results.  Other health care providers may not recommend any screening for nonpregnant women who are considered low risk for pelvic cancer and who do not have symptoms. Ask your health care provider if a screening pelvic exam is right for you.  If you have had past treatment for cervical cancer or a condition that could lead to cancer, you need Pap tests and screening for cancer for at least 20 years after your treatment. If Pap tests have been discontinued, your risk factors (such as having a new sexual partner) need to be reassessed to determine if screening should resume. Some women have medical problems that increase the chance of getting cervical cancer. In these cases, your health care provider may recommend more frequent screening and Pap tests.  Colorectal Cancer  This type of cancer can be detected and often prevented.  Routine colorectal cancer screening usually begins at 49 years of age and continues through 49 years of age.  Your health care provider may recommend screening at an earlier age if you have risk factors  for colon cancer.  Your health care provider may also recommend using home test kits to check for hidden blood in the stool.  A small camera at the end of a tube can be used to examine your colon directly (sigmoidoscopy or colonoscopy). This is done to check for the earliest forms of colorectal cancer.  Routine screening usually begins at age 60.  Direct examination of the colon should be repeated every 5-10 years through 49 years of age. However, you may need to be screened more often if early forms of precancerous polyps or small growths are found.  Skin Cancer  Check your skin from head to toe regularly.  Tell your health care provider about any new moles or changes in moles, especially if there is a change in a mole's shape or color.  Also tell your health care provider if you have a mole that is larger than the size of a pencil eraser.  Always use sunscreen. Apply sunscreen liberally and repeatedly throughout the day.  Protect yourself by  wearing long sleeves, pants, a wide-brimmed hat, and sunglasses whenever you are outside.  Heart disease, diabetes, and high blood pressure  High blood pressure causes heart disease and increases the risk of stroke. High blood pressure is more likely to develop in: ? People who have blood pressure in the high end of the normal range (130-139/85-89 mm Hg). ? People who are overweight or obese. ? People who are African American.  If you are 48-51 years of age, have your blood pressure checked every 3-5 years. If you are 17 years of age or older, have your blood pressure checked every year. You should have your blood pressure measured twice-once when you are at a hospital or clinic, and once when you are not at a hospital or clinic. Record the average of the two measurements. To check your blood pressure when you are not at a hospital or clinic, you can use: ? An automated blood pressure machine at a pharmacy. ? A home blood pressure monitor.  If  you are between 67 years and 58 years old, ask your health care provider if you should take aspirin to prevent strokes.  Have regular diabetes screenings. This involves taking a blood sample to check your fasting blood sugar level. ? If you are at a normal weight and have a low risk for diabetes, have this test once every three years after 49 years of age. ? If you are overweight and have a high risk for diabetes, consider being tested at a younger age or more often. Preventing infection Hepatitis B  If you have a higher risk for hepatitis B, you should be screened for this virus. You are considered at high risk for hepatitis B if: ? You were born in a country where hepatitis B is common. Ask your health care provider which countries are considered high risk. ? Your parents were born in a high-risk country, and you have not been immunized against hepatitis B (hepatitis B vaccine). ? You have HIV or AIDS. ? You use needles to inject street drugs. ? You live with someone who has hepatitis B. ? You have had sex with someone who has hepatitis B. ? You get hemodialysis treatment. ? You take certain medicines for conditions, including cancer, organ transplantation, and autoimmune conditions.  Hepatitis C  Blood testing is recommended for: ? Everyone born from [birth year] through 1963. ? Anyone with known risk factors for hepatitis C.  Sexually transmitted infections (STIs)  You should be screened for sexually transmitted infections (STIs) including gonorrhea and chlamydia if: ? You are sexually active and are younger than 49 years of age. ? You are older than 49 years of age and your health care provider tells you that you are at risk for this type of infection. ? Your sexual activity has changed since you were last screened and you are at an increased risk for chlamydia or gonorrhea. Ask your health care provider if you are at risk.  If you do not have HIV, but are at risk, it may be recommended  that you take a prescription medicine daily to prevent HIV infection. This is called pre-exposure prophylaxis (PrEP). You are considered at risk if: ? You are sexually active and do not regularly use condoms or know the HIV status of your partner(s). ? You take drugs by injection. ? You are sexually active with a partner who has HIV.  Talk with your health care provider about whether you are at high risk of being infected with  HIV. If you choose to begin PrEP, you should first be tested for HIV. You should then be tested every 3 months for as long as you are taking PrEP. Pregnancy  If you are premenopausal and you may become pregnant, ask your health care provider about preconception counseling.  If you may become pregnant, take 400 to 800 micrograms (mcg) of folic acid every day.  If you want to prevent pregnancy, talk to your health care provider about birth control (contraception). Osteoporosis and menopause  Osteoporosis is a disease in which the bones lose minerals and strength with aging. This can result in serious bone fractures. Your risk for osteoporosis can be identified using a bone density scan.  If you are 21 years of age or older, or if you are at risk for osteoporosis and fractures, ask your health care provider if you should be screened.  Ask your health care provider whether you should take a calcium or vitamin D supplement to lower your risk for osteoporosis.  Menopause may have certain physical symptoms and risks.  Hormone replacement therapy may reduce some of these symptoms and risks. Talk to your health care provider about whether hormone replacement therapy is right for you. Follow these instructions at home:  Schedule regular health, dental, and eye exams.  Stay current with your immunizations.  Do not use any tobacco products including cigarettes, chewing tobacco, or electronic cigarettes.  If you are pregnant, do not drink alcohol.  If you are  breastfeeding, limit how much and how often you drink alcohol.  Limit alcohol intake to no more than 1 drink per day for nonpregnant women. One drink equals 12 ounces of beer, 5 ounces of wine, or 1 ounces of hard liquor.  Do not use street drugs.  Do not share needles.  Ask your health care provider for help if you need support or information about quitting drugs.  Tell your health care provider if you often feel depressed.  Tell your health care provider if you have ever been abused or do not feel safe at home. This information is not intended to replace advice given to you by your health care provider. Make sure you discuss any questions you have with your health care provider. Document Released: 08/28/2010 Document Revised: 07/21/2015 Document Reviewed: 11/16/2014 Elsevier Interactive Patient Education  [employer].

## 2016-09-17 NOTE — Assessment & Plan Note (Signed)
Followed by Dr[facility]utlaw [facility]. Was previously on Remicade. Most recent colonoscopy in May 2018. She is considering changing to [facility], she will let us know what she decides.

## 2016-09-17 NOTE — Assessment & Plan Note (Signed)
Patient remains on methotrexate 6 tabs/wk and prednisone 10 mg daily, with goal to get to 5 mg daily. Followed by [name] and Dr. Marchelle Gearingamaswamy.

## 2016-09-17 NOTE — Assessment & Plan Note (Signed)
Currently without CPAP. Has not had a sleep study in several years but is interested in using CPAP again. Will message her pulmonologist to see if this can be addressed by them.

## 2016-09-17 NOTE — Progress Notes (Signed)
Christina Poole is a 51 y.o. female here to Establish Care and Annual physical exam.  I acted as a Neurosurgeon for [employer], PA-C Corky Mull, LPN  History of Present Illness:   Chief Complaint  Patient presents with  . Establish Care    BC/BS  . Annual Exam    Acute Concerns: None  Chronic Issues: Essential HTN -- currently on amlodopine-benazapril 5-20 mg and has been on this medication for several years; does not have any no chest pain, SOB, LE swelling, blurred vision. Does not check blood pressures at home regularly. BMP completed 10 days ago, normal K and renal function.  Sleep apnea -- diagnosed sleep apnea in the past and used a CPAP for about 1 year, has not used her CPAP in several years but is interested in re-testing for this Colitis -- sees Dr. Dulce Sellar and has been on Remicade in the past. Most recent colonoscopy this past May and was normal.  Sarcoidosis -- sees Dr. Marchelle Gearing and Rubye Oaks, NP with [facility]. Patient remains on methotrexate 6 tabs/wk and prednisone 10 mg daily. Per most recent cardiology note: Echocardiogram 08/23/2015: Normal ejection fraction 65%. Right-sided pressures are normal. Pulmonary artery pressure is reported as normal. High resolution CT chest 10/04/2015 showed patchy nodularity involving the mid to upper lungs consistent with pulmonary sarcoidosis, interstitial lung disease., Mild to moderate mediastinal and bilateral hilar lymphadenopathy. Primary function test 10/03/2015 showed normal lung function with FEV1 at 101%, ratio 77, FVC 105%, no significant bronchodilator response, DLCO 102% ACE 28 09/2015   Health Maintenance: Immunizations --  Requesting records Colonoscopy -- yearly, last done this year Mammogram -- no hx of abnormal mammogram PAP -- no recent abnormal paps Bone Density -- most recently done several years ago Diet -- eat all food groups, eats out a lot, drinks water and unsweetened  Caffeine intake --  coffee in the AM Sleep habits -- not good, prednisone daily helps, 4-5 hours daily Exercise -- starting to increase exercise Weight -- Weight: 193 lb (87.5 kg) -- high for her, more comfortable around 150 lb Mood -- no issues of anxiety  Depression screen PHQ 2/9 09/17/2016  Decreased Interest 0  Down, Depressed, Hopeless 0  PHQ - 2 Score 0    No flowsheet data found.  Other providers/specialists: Dr. Marchelle Gearing -- sarcoidosis Dr. Dulce Sellar -- colitis [facility]   Past Medical History:  Diagnosis Date  . Colitis   . Sarcoidosis      Social History   Social History  . Marital status: Married    Spouse name: N/A  . Number of children: N/A  . Years of education: N/A   Occupational History  . Not on file.   Social History Main Topics  . Smoking status: Never Smoker  . Smokeless tobacco: Never Used  . Alcohol use No  . Drug use: No  . Sexual activity: Yes    Birth control/ protection: None   Other Topics Concern  . Not on file   Social History Narrative   From [place], went to [employer]    Married, two children, twins    Past Surgical History:  Procedure Laterality Date  . APPENDECTOMY    . CESAREAN SECTION    . MYOMECTOMY      Family History  Problem Relation Age of Onset  . Colon cancer Father        in his 28s    Allergies  Allergen Reactions  . Codeine  Severe unrelenting nausea     Current Medications:   Current Outpatient Prescriptions:  .  amLODipine-benazepril (LOTREL) 5-20 MG per capsule, Take 1 capsule by mouth daily., Disp: , Rfl:  .  Cholecalciferol (VITAMIN D3) 2000 units TABS, Take 1 capsule by mouth., Disp: , Rfl:  .  clobetasol cream (TEMOVATE) 0.05 %, Apply 1 application topically 2 (two) times daily., Disp: , Rfl:  .  folic acid (FOLVITE) 1 MG tablet, Take 1 mg by mouth daily., Disp: , Rfl:  .  methotrexate 2.5 MG tablet, Take 6 tabs every Friday, Disp: 30 tablet, Rfl: 5 .  Multiple Vitamins-Minerals  (HAIR/SKIN/NAILS) CAPS, Take 2 capsules by mouth daily., Disp: , Rfl:  .  predniSONE (DELTASONE) 10 MG tablet, Take 10 mg by mouth daily with breakfast., Disp: , Rfl:  .  inFLIXimab (REMICADE) 100 MG injection, Inject 100 mg into the vein every 8 (eight) weeks., Disp: , Rfl:    Review of Systems:   Review of Systems  Constitutional: Negative for chills, fever, malaise/fatigue and weight loss.  HENT: Negative for hearing loss, sinus pain and sore throat.   Eyes: Negative for blurred vision.  Respiratory: Negative for cough and shortness of breath.   Cardiovascular: Negative for chest pain, palpitations and leg swelling.  Gastrointestinal: Negative for abdominal pain, constipation, diarrhea, heartburn, nausea and vomiting.  Genitourinary: Negative for dysuria, frequency and urgency.  Musculoskeletal: Negative for back pain, myalgias and neck pain.  Skin: Negative for itching and rash.  Neurological: Negative for dizziness, tingling, seizures, loss of consciousness and headaches.  Endo/Heme/Allergies: Negative for polydipsia.  Psychiatric/Behavioral: Negative for depression. The patient is not nervous/anxious.     Vitals:   Vitals:   09/17/16 1447  BP: 120/80  Pulse: 82  Temp: 98.2 F (36.8 C)  TempSrc: Oral  SpO2: 96%  Weight: 193 lb (87.5 kg)  Height: 5\' 1"  (1.549 m)     Body mass index is 36.47 kg/m.  Physical Exam:   Physical Exam  Constitutional: She appears well-developed and well-nourished. She is cooperative.  Non-toxic appearance. She does not have a sickly appearance. She does not appear ill. No distress.  HENT:  Head: Normocephalic and atraumatic.  Right Ear: Tympanic membrane, external ear and ear canal normal. Tympanic membrane is not erythematous, not retracted and not bulging.  Left Ear: Tympanic membrane, external ear and ear canal normal. Tympanic membrane is not erythematous, not retracted and not bulging.  Eyes: Pupils are equal, round, and reactive to  light. Conjunctivae, EOM and lids are normal.  Neck: Trachea normal and full passive range of motion without pain.  Cardiovascular: Normal rate, regular rhythm, S1 normal, S2 normal, normal heart sounds and intact distal pulses.   Pulmonary/Chest: Effort normal and breath sounds normal. No tachypnea. No respiratory distress. She has no decreased breath sounds. She has no wheezes. She has no rhonchi. She has no rales.  Abdominal: Soft. Normal appearance and bowel sounds are normal. There is no tenderness.  Musculoskeletal: Normal range of motion.  Lymphadenopathy:    She has no cervical adenopathy.  Neurological: She is alert. She has normal reflexes. No cranial nerve deficit or sensory deficit. GCS eye subscore is 4. GCS verbal subscore is 5. GCS motor subscore is 6.  Skin: Skin is warm, dry and intact.  Multiple dark-colored skin lesions throughout bilateral lower legs, no evidence of infection or erythema  Psychiatric: She has a normal mood and affect. Her speech is normal and behavior is normal.  Nursing note and  vitals reviewed.    Assessment and Plan:    Problem List Items Addressed This Visit      Cardiovascular and Mediastinum   Essential hypertension    Well controlled on  amlodopine-benazapril 5-20 mg. Denies any need for refills at this time. Refill when needed. Recommended regular monitoring of blood pressure at home.        Respiratory   Obstructive sleep apnea    Currently without CPAP. Has not had a sleep study in several years but is interested in using CPAP again. Will message her pulmonologist to see if this can be addressed by them.        Digestive   Colitis    Followed by Dr. Dulce Sellar with [facility]. Was previously on Remicade. Most recent colonoscopy in May 2018. She is considering changing to [facility], she will let us know what she decides.        Other   Sarcoidosis    Patient remains on methotrexate 6 tabs/wk and prednisone 10 mg daily, with goal to get to 5  mg daily. Followed by [name] and Dr. Marchelle Gearing.       Other Visit Diagnoses    Routine general medical examination at a health care facility    -  Primary Today patient counseled on age appropriate routine health concerns for screening and prevention, each reviewed and up to date or declined. Immunizations reviewed and up to date or declined. Labs ordered and reviewed. Risk factors for depression reviewed and negative. Hearing function and visual acuity are intact. ADLs screened and addressed as needed. Functional ability and level of safety reviewed and appropriate. Education, counseling and referrals performed based on assessed risks today. Patient provided with a copy of personalized plan for preventive services.  She declined HIV screening.    Encounter for lipid screening for cardiovascular disease     She is not fasting today and will return for fasting labs.       . Reviewed expectations re: course of current medical issues. . Discussed self-management of symptoms. . Outlined signs and symptoms indicating need for more acute intervention. . Patient verbalized understanding and all questions were answered. . See orders for this visit as documented in the electronic medical record. . Patient received an After-Visit Summary.  CMA or LPN served as scribe during this visit. History, Physical, and Plan performed by medical provider. Documentation and orders reviewed and attested to.  Jarold Motto, PA-C

## 2016-09-17 NOTE — Assessment & Plan Note (Signed)
Well controlled on  amlodopine-benazapril 5-20 mg. Denies any need for refills at this time. Refill when needed. Recommended regular monitoring of blood pressure at home.

## 2016-09-18 ENCOUNTER — Telehealth: Payer: Self-pay | Admitting: Physician Assistant

## 2016-09-18 ENCOUNTER — Other Ambulatory Visit (INDEPENDENT_AMBULATORY_CARE_PROVIDER_SITE_OTHER): Payer: BLUE CROSS/BLUE SHIELD

## 2016-09-18 DIAGNOSIS — Z13220 Encounter for screening for lipoid disorders: Secondary | ICD-10-CM

## 2016-09-18 DIAGNOSIS — Z136 Encounter for screening for cardiovascular disorders: Secondary | ICD-10-CM

## 2016-09-18 LAB — LIPID PANEL
CHOLESTEROL: 225 mg/dL — AB (ref 0–200)
HDL: 54 mg/dL (ref >39.00)
LDL Cholesterol: 149 mg/dL — ABNORMAL HIGH (ref 0–99)
NonHDL: 170.58
TRIGLYCERIDES: 109 mg/dL (ref 0.0–149.0)
Total CHOL/HDL Ratio: 4
VLDL: 21.8 mg/dL (ref 0.0–40.0)

## 2016-09-18 NOTE — Telephone Encounter (Signed)
Patient calling to get lab results from today that are available on mychart however, mychart keeps kicking her out. Please call the patient to discuss lab results.

## 2016-09-19 NOTE — Telephone Encounter (Signed)
Spoke to pt, told her Lelon MastSamantha had sent a My Chart message to you, but Your cholesterol labs have been completed, results given to pt. No indication to start medication at this time, work on diet and exercise per [place]. Pt verbalized understanding and said yes her My Chart is not working at present.

## 2016-09-24 ENCOUNTER — Telehealth: Payer: Self-pay | Admitting: Physician Assistant

## 2016-09-24 ENCOUNTER — Telehealth: Payer: Self-pay

## 2016-09-24 NOTE — Telephone Encounter (Signed)
ROI Faxed to [facility] [phone] mc

## 2016-09-24 NOTE — Telephone Encounter (Signed)
ROI Faxed to [facility] [phone] mc

## 2016-09-24 NOTE — Telephone Encounter (Signed)
This note was entered by mistake [id number] mc

## 2016-09-25 ENCOUNTER — Telehealth: Payer: Self-pay | Admitting: Physician Assistant

## 2016-09-25 NOTE — Telephone Encounter (Signed)
Rec'd from [facility] forwarded 6 pages to [employer] PA

## 2016-09-26 ENCOUNTER — Encounter: Payer: Self-pay | Admitting: Physician Assistant

## 2016-09-26 DIAGNOSIS — M25569 Pain in unspecified knee: Secondary | ICD-10-CM | POA: Insufficient documentation

## 2016-09-26 DIAGNOSIS — M25562 Pain in left knee: Secondary | ICD-10-CM | POA: Insufficient documentation

## 2016-09-28 ENCOUNTER — Ambulatory Visit (INDEPENDENT_AMBULATORY_CARE_PROVIDER_SITE_OTHER): Payer: BLUE CROSS/BLUE SHIELD | Admitting: *Deleted

## 2016-09-28 DIAGNOSIS — Z111 Encounter for screening for respiratory tuberculosis: Secondary | ICD-10-CM

## 2016-09-28 DIAGNOSIS — Z23 Encounter for immunization: Secondary | ICD-10-CM | POA: Diagnosis not present

## 2016-09-28 NOTE — Progress Notes (Signed)
Pt presented to office for Tdap and PPD. PPD placed and pt told to return on Monday at 9:30 to have read. Pt verbalized understanding.

## 2016-09-28 NOTE — Patient Instructions (Signed)
Pt to return on Monday 10/01/2016 at 9:30 to have PPD read.

## 2016-10-01 LAB — TB SKIN TEST
Induration: 0 mm
TB Skin Test: NEGATIVE

## 2016-10-17 ENCOUNTER — Telehealth: Payer: Self-pay | Admitting: Internal Medicine

## 2016-10-17 NOTE — Telephone Encounter (Signed)
Pt's OV rescheduled at pt's convenience.  Reviewed prednisone dosage with her based on last OV.  Nothing further needed at this time.

## 2016-10-18 ENCOUNTER — Ambulatory Visit: Payer: BC Managed Care – PPO | Admitting: Internal Medicine

## 2016-11-08 ENCOUNTER — Ambulatory Visit (INDEPENDENT_AMBULATORY_CARE_PROVIDER_SITE_OTHER): Payer: BC Managed Care – PPO | Admitting: Adult Health

## 2016-11-08 ENCOUNTER — Encounter: Payer: Self-pay | Admitting: Adult Health

## 2016-11-08 DIAGNOSIS — D869 Sarcoidosis, unspecified: Secondary | ICD-10-CM | POA: Diagnosis not present

## 2016-11-08 DIAGNOSIS — J209 Acute bronchitis, unspecified: Secondary | ICD-10-CM | POA: Diagnosis not present

## 2016-11-08 MED ORDER — PREDNISONE 5 MG PO TABS: 5.0000 mg | ORAL_TABLET | Freq: Every day | ORAL | 3 refills | Status: DC

## 2016-11-08 MED ORDER — FOLIC ACID 1 MG PO TABS: 1.0000 mg | ORAL_TABLET | Freq: Every day | ORAL | 3 refills | Status: DC

## 2016-11-08 MED ORDER — AMOXICILLIN-POT CLAVULANATE 875-125 MG PO TABS: 1.0000 | ORAL_TABLET | Freq: Two times a day (BID) | ORAL | 0 refills | Status: AC

## 2016-11-08 NOTE — Assessment & Plan Note (Signed)
Flare   Plan  Patient Instructions  Augmentin 875mg  Twice daily  For 1 week, take with food.  Mucinex DM Twice daily  As needed  Cough/congestion .  Increase Prednisone 10mg  daily for 1 week then 5mg  daily  Discuss with Primary MD if cough persists that Lotrel may be aggravating cough.  Follow up with Dr. Marchelle Gearingamaswamy in 3 months with Spirometry with DLCO .  Please contact office for sooner follow up if symptoms do not improve or worsen or seek emergency care

## 2016-11-08 NOTE — Patient Instructions (Signed)
Augmentin 875mg  Twice daily  For 1 week, take with food.  Mucinex DM Twice daily  As needed  Cough/congestion .  Increase Prednisone 10mg  daily for 1 week then 5mg  daily  Discuss with Primary MD if cough persists that Lotrel may be aggravating cough.  Follow up with Dr. Marchelle Gearingamaswamy in 3 months with Spirometry with DLCO .  Please contact office for sooner follow up if symptoms do not improve or worsen or seek emergency care

## 2016-11-08 NOTE — Progress Notes (Signed)
  ID: Christina Poole, female    DOB: 22-May-1965, 51 y.o.   MRN: [id number]  Chief Complaint  Patient presents with  . Follow-up    Sarcoid     Referring provider: Jarold Motto, PA  HPI: 51 year old female never smoker followed for sarcoidosis (skin bx 2017 )  Hx of IBD -[facility] on Remicade -on hold   TEST  Echocardiogram 08/23/2015: Normal ejection fraction 65%. Right-sided pressures are normal. Pulmonary artery pressure is reported as normal. High resolution CT chest 10/04/2015 showed patchy nodularity involving the mid to upper lungs consistent with pulmonary sarcoidosis, interstitial lung disease., Mild to moderate mediastinal and bilateral hilar lymphadenopathy. Primary function test 10/03/2015 showed normal lung function with FEV1 at 101%, ratio 77, FVC 105%, no significant bronchodilator response, DLCO 102% ACE 28 09/2015   11/08/2016 Follow up : Sarcoid  Pt returns for 3 month follow up for Sarcoid  With skin and pulmonary involvement.  Remains on prednisone  daily  And Methotrexate 6 tabs/wk .  Says she is doing okay until 2 weeks . She developed cough, congestion , thick mucus  , nasal drainage and congestion . No fever , hemoptysis , chest pain, orthopnea or edema.  Using Mucinex DM. On ACE , denies cough until 2 weeks ago.    Allergies  Allergen Reactions  . Codeine     Severe unrelenting nausea    Immunization History  Administered Date(s) Administered  . Influenza Split 12/31/2014  . Influenza-Unspecified 12/03/2015  . PPD Test 09/28/2016  . Tdap 09/28/2016    Past Medical History:  Diagnosis Date  . Colitis   . Sarcoidosis     Tobacco History: History  Smoking Status  . Never Smoker  Smokeless Tobacco  . Never Used   Counseling given: Not Answered   Outpatient Encounter Prescriptions as of 11/08/2016  Medication Sig  . amLODipine-benazepril (LOTREL) 5-20 MG per capsule Take 1 capsule by mouth daily.  . Cholecalciferol (VITAMIN  D3) 2000 units TABS Take 1 capsule by mouth.  . clobetasol cream (TEMOVATE) 0.05 % Apply 1 application topically 2 (two) times daily.  . folic acid (FOLVITE) 1 MG tablet Take 1 mg by mouth daily.  Marland Kitchen inFLIXimab (REMICADE) 100 MG injection Inject 100 mg into the vein every 8 (eight) weeks.  . methotrexate 2.5 MG tablet Take 6 tabs every Friday  . Multiple Vitamins-Minerals (HAIR/SKIN/NAILS) CAPS Take 2 capsules by mouth daily.  . predniSONE (DELTASONE) 10 MG tablet Take 10 mg by mouth daily with breakfast.  . amoxicillin-clavulanate (AUGMENTIN) 875-125 MG tablet Take 1 tablet by mouth 2 (two) times daily.   No facility-administered encounter medications on file as of 11/08/2016.      Review of Systems  Constitutional:   No  weight loss, night sweats,  Fevers, chills, fatigue, or  lassitude.  HEENT:   No headaches,  Difficulty swallowing,  Tooth/dental problems, or  Sore throat,                No sneezing, itching, ear ache, + nasal congestion, post nasal drip,   CV:  No chest pain,  Orthopnea, PND, swelling in lower extremities, anasarca, dizziness, palpitations, syncope.   GI  No heartburn, indigestion, abdominal pain, nausea, vomiting, diarrhea, change in bowel habits, loss of appetite, bloody stools.   Resp:   No chest wall deformity  Skin: no rash or lesions.  GU: no dysuria, change in color of urine, no urgency or frequency.  No flank pain, no hematuria  MS:  No joint pain or swelling.  No decreased range of motion.  No back pain.    Physical Exam  BP 124/82 (BP Location: Left Arm, Cuff Size: Normal)   Pulse 76   Ht 5\' 1"  (1.549 m)   Wt 199 lb (90.3 kg)   SpO2 98%   BMI 37.60 kg/m   GEN: A/Ox3; pleasant , NAD    HEENT:  [place]/AT,  EACs-clear, TMs-wnl, NOSE-clear drainage , THROAT-clear, no lesions, no postnasal drip or exudate noted.   NECK:  Supple w/ fair ROM; no JVD; normal carotid impulses w/o bruits; no thyromegaly or nodules palpated; no lymphadenopathy.     RESP  Clear  P & A; w/o, wheezes/ rales/ or rhonchi. no accessory muscle use, no dullness to percussion  CARD:  RRR, no m/r/g, no peripheral edema, pulses intact, no cyanosis or clubbing.  GI:   Soft & nt; nml bowel sounds; no organomegaly or masses detected.   Musco: Warm bil, no deformities or joint swelling noted.   Neuro: alert, no focal deficits noted.    Skin: Warm, no lesions or rashes    Lab Results:  CBC  BMET  BNP No results found for: BNP  ProBNP No results found for: PROBNP  Imaging: No results found.   Assessment & Plan:   Sarcoidosis Stable   Plan  Cont on current .   Acute bronchitis Flare   Plan  Patient Instructions  Augmentin 875mg  Twice daily  For 1 week, take with food.  Mucinex DM Twice daily  As needed  Cough/congestion .  Increase Prednisone 10mg  daily for 1 week then 5mg  daily  Discuss with Primary MD if cough persists that Lotrel may be aggravating cough.  Follow up with Dr. RamaswamyMarchelle Gearing in 3 months with Spirometry with DLCO .  Please contact office for sooner follow up if symptoms do not improve or worsen or seek emergency care         Rubye Oaksammy Gwendolyne Welford, NP 11/08/2016

## 2016-11-08 NOTE — Addendum Note (Signed)
Addended by: Boone MasterJONES, Nehemyah Foushee E on: 11/08/2016 03:10 PM   Modules accepted: Orders

## 2016-11-08 NOTE — Assessment & Plan Note (Signed)
Stable   Plan  Cont on current .

## 2016-11-21 ENCOUNTER — Telehealth: Payer: Self-pay | Admitting: Internal Medicine

## 2016-11-21 NOTE — Telephone Encounter (Signed)
Ensure fu for sarcoid with APP/me  Dr. Kalman Shan, M.D., [facility].C.P Pulmonary and Critical Care Medicine Staff Physician [facility]  Pulmonary and Critical Care Pager: [phone], If no answer or between  15:00h - 7:00h: call [phone]  11/21/2016 9:25 PM

## 2016-11-22 NOTE — Telephone Encounter (Signed)
Patient has been scheduled for a follow up appointment on 12/17/16 at 11:30. I called and left message for patient regarding this. I advised patient that if this does not work to please call us back to have it changed.

## 2016-12-17 ENCOUNTER — Ambulatory Visit: Payer: BC Managed Care – PPO | Admitting: Internal Medicine

## 2016-12-30 ENCOUNTER — Other Ambulatory Visit: Payer: Self-pay | Admitting: Adult Health

## 2017-01-19 ENCOUNTER — Encounter: Payer: Self-pay | Admitting: Physician Assistant

## 2017-01-21 MED ORDER — AMLODIPINE BESY-BENAZEPRIL HCL 5-20 MG PO CAPS: 1.0000 | ORAL_CAPSULE | Freq: Every day | ORAL | 0 refills | Status: DC

## 2017-01-21 NOTE — Telephone Encounter (Signed)
Rx refill for Lotrel sent to pharmacy as requested.

## 2017-02-12 ENCOUNTER — Other Ambulatory Visit: Payer: Self-pay | Admitting: Internal Medicine

## 2017-02-12 DIAGNOSIS — D869 Sarcoidosis, unspecified: Secondary | ICD-10-CM

## 2017-02-13 ENCOUNTER — Ambulatory Visit (INDEPENDENT_AMBULATORY_CARE_PROVIDER_SITE_OTHER): Payer: BC Managed Care – PPO | Admitting: Internal Medicine

## 2017-02-13 ENCOUNTER — Ambulatory Visit: Payer: BC Managed Care – PPO | Admitting: Internal Medicine

## 2017-02-13 ENCOUNTER — Encounter: Payer: Self-pay | Admitting: Internal Medicine

## 2017-02-13 VITALS — BP 132/68 | HR 75 | Ht 61.0 in | Wt 206.0 lb

## 2017-02-13 DIAGNOSIS — Z5181 Encounter for therapeutic drug level monitoring: Secondary | ICD-10-CM | POA: Diagnosis not present

## 2017-02-13 DIAGNOSIS — Z79899 Other long term (current) drug therapy: Secondary | ICD-10-CM

## 2017-02-13 DIAGNOSIS — L52 Erythema nodosum: Secondary | ICD-10-CM | POA: Diagnosis not present

## 2017-02-13 DIAGNOSIS — D869 Sarcoidosis, unspecified: Secondary | ICD-10-CM

## 2017-02-13 DIAGNOSIS — Z23 Encounter for immunization: Secondary | ICD-10-CM | POA: Diagnosis not present

## 2017-02-13 NOTE — Progress Notes (Signed)
PFT pre and DLCO completed today 02/13/17

## 2017-02-13 NOTE — Patient Instructions (Addendum)
ICD-10-CM   1. Sarcoidosis D86.9   2. Hypercalcemia E83.52   3. Erythema nodosum L52   4. Encounter for monitoring immunomodulating therapy Z51.81    Z79.899     Clinically asymptomatic Noted off prednisone  Plan Flu shot 02/13/2017 Check bmet, calciium, and LFT - monitor for methotrexate toxicity and sarcoid activity Get cxr 2 view if results are normal, could consider stopping methotrexate but given high calcium my inclindation is for you to continue  Followup - will call with results  - return in 3-5  months to reassess

## 2017-02-13 NOTE — Progress Notes (Signed)
Subjective:     Patient ID: Christina LarocheMelody M Sax, female   DOB: 09/21/1965, 51 y.o.   MRN: [id number]  HPI  PCP No PCP Per Patient Gastro Dr. Dulce Sellarutlaw Dermatologist Dr. Arlys JohnBrian sSTrauss    HPI   IOV 09/30/2015  Chief Complaint  Patient presents with  . Advice Only    Referred for abn cxr, sarcoidosis.  cxr in Epic. Denies any breathing complaints currently.      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 [place] to [place] area approximately 2 years ago. She has inflammatory bowel disease and is under the care of [facility]iss[facility] NoaWilliam outlaw [facility] and his been on Remicade for the last several years every 2 months. She has not had a single flareup and being on Remicade. A year ago she started noticing skin lesions in bilateral shin that did not resolve. Therefore a few months ago she saw Dr. Arlys JohnBrian Straussdermatologist was subjected to biopsy which confirmed sarcoidosis. For the last 3 weeks, after failing topical therapy, she's been on prednisone 5 mg every other day and this has helped the skin lesions. She's not having any side effects from the prednisone. There is concern that she might have pulmonary sarcoidosis because on 08/23/2015 she had a cardiac echocardiogram that was normal and a chest x-ray that is reported as abnormal. I personally visualized this chest x-ray and noticed hilar adenopathy and possible interstitial infiltrates. She says that these tests were done as part of workup related to her frozen embryos she did not want to detail any further. She says at baseline she walks 3 miles every few days and is not symptomatic. She denies any chest pain, cough, dyspnea, hemoptysis, night sweats, chills.   Echocardiogram 08/23/2015: Normal ejection fraction 65%. Right-sided pressures are normal. Pulmonary artery pressure is reported as normal.  02/12/2014 blood work: Creatinine 0.8 mg percent hemoglobin 13 g percent.   D/w patient: call 8?8/17 - CT findings are typical of  sarcoid - - Ddx is opportunistic infection due to TNFalpha blockade. she is asymptomatic and continues to be so. PFT normal  Based on above  - discussed monuitoring v bronch/bal/tbbx (low yield);' she opted to monitor without bronch  Plan  - cancel mid sept 2017 ov - change fu to 3 months from 10/27/2015  - get spiro and dlco (no lung volume, no bd response) in 3 months prior to next OV  Thanks    09/07/2016 Follow up : Sarcoid  Pt returns for 9 month follow up for Sarcoid  Moved to [place] briefly and seen by Pulmonary . She is getting copy of CT chest that was done.  Remains on Methotrexate (6 tabs /wk)  And Prednisone 10mg  daily  . She has skin and pulmonary involvement . Her Leg lesions are better but not gone.  No cough or dyspnea, hemoptysis .   Reports Calcium level was elevated and was given steroid burst over last month , now on 10mg  daily.   IBD is well controlled, has been taken off Remicade currently .    11/08/2016 Follow up : Sarcoid  Pt returns for 3 month follow up for Sarcoid  With skin and pulmonary involvement.  Remains on prednisone 5mg  daily  And Methotrexate 6 tabs/wk .  Says she is doing okay until 2 weeks . She developed cough, congestion , thick mucus  , nasal drainage and congestion . No fever , hemoptysis , chest pain, orthopnea or edema.  Using Mucinex DM. On ACE , denies cough until 2  weeks ago.   OV 02/13/2017  Chief Complaint  Patient presents with  . Follow-up    follow up from PFT- results/POC   I personally have not seen Christina Poole in almost a year and a half.  She had moved in August 2017 to [place] and [place] in [place].  While in [place] she establish herself with a pulmonologist for her sarcoidosis.  Apparently had a hard time getting hold of her high-resolution CT scan of the chest.  Of note she was diagnosed with sarcoidosis based on erythema nodosum and high angiotensin converting enzyme  levels.  She was always asymptomatic from a respiratory standpoint but when we did a high-resolution CT scan of the chest in August 2017 she had pulmonary infiltrates [she was on Remicade at that time for colitis for 10 years; she stopped this in 2017 when she moved to [place] and is not had any problems with colitis since].  She tells me that while she was in [place] she was diagnosed with hypercalcemia presumably due to sarcoidosis.  She was committed to methotrexate and prednisone.  She only started methotrexate in June 2018 and prednisone 1 year ago.  But she is off the prednisone now because of side effects in the last few weeks.  She slowly tapered and stopped.  She continues methotrexate at a very low dose of 5 mg once weekly.  [place] in July 2018 is normal.  She continues to be asymptomatic from a respiratory standpoint.  Her pulmonary function test today is normal.  The erythema nodosum in her feet have essentially scarred down but you can still see it.  There is no skin lesions in the face or eye or palpitation issues.  She not known to have any renal sarcoidosis.   Results for Christina LarocheCHAZON, Riti M (MRN [id number]) as of 02/13/2017 17:24  Ref. Range 02/12/2014 15:40 09/07/2016 11:08  Calcium Latest Ref Range: 8.4 - 10.5 mg/dL 9.5 9.7    has a past medical history of Colitis and Sarcoidosis.   reports that  has never smoked. she has never used smokeless tobacco.  Past Surgical History:  Procedure Laterality Date  . APPENDECTOMY    . CESAREAN SECTION    . MYOMECTOMY      Allergies  Allergen Reactions  . Codeine     Severe unrelenting nausea    Immunization History  Administered Date(s) Administered  . Influenza Split 12/31/2014  . Influenza-Unspecified 12/03/2015  . PPD Test 09/28/2016  . Tdap 09/28/2016    Family History  Problem Relation Age of Onset  . Colon cancer Father        in his 3060s  . Diabetes Mother   .  Hypercholesterolemia Mother      Current Outpatient Medications:  .  amLODipine-benazepril (LOTREL) 5-20 MG capsule, Take 1 capsule by mouth daily., Disp: 90 capsule, Rfl: 0 .  folic acid (FOLVITE) 1 MG tablet, Take 1 tablet (1 mg total) by mouth daily., Disp: 30 tablet, Rfl: 3 .  methotrexate 2.5 MG tablet, Take 6 tabs every Friday, Disp: 30 tablet, Rfl: 5 .  Multiple Vitamins-Minerals (HAIR/SKIN/NAILS) CAPS, Take 2 capsules by mouth daily., Disp: , Rfl:    Review of Systems     Objective:   Physical Exam  Constitutional: She is oriented to person, place, and time. She appears well-developed and well-nourished. No distress.  HENT:  Head: Normocephalic and atraumatic.  Right Ear: External ear  normal.  Left Ear: External ear normal.  Mouth/Throat: Oropharynx is clear and moist. No oropharyngeal exudate.  Eyes: Conjunctivae and EOM are normal. Pupils are equal, round, and reactive to light. Right eye exhibits no discharge. Left eye exhibits no discharge. No scleral icterus.  Neck: Normal range of motion. Neck supple. No JVD present. No tracheal deviation present. No thyromegaly present.  Cardiovascular: Normal rate, regular rhythm, normal heart sounds and intact distal pulses. Exam reveals no gallop and no friction rub.  No murmur heard. Pulmonary/Chest: Effort normal and breath sounds normal. No respiratory distress. She has no wheezes. She has no rales. She exhibits no tenderness.  Abdominal: Soft. Bowel sounds are normal. She exhibits no distension and no mass. There is no tenderness. There is no rebound and no guarding.  Musculoskeletal: Normal range of motion. She exhibits no edema or tenderness.  Lymphadenopathy:    She has no cervical adenopathy.  Neurological: She is alert and oriented to person, place, and time. She has normal reflexes. No cranial nerve deficit. She exhibits normal muscle tone. Coordination normal.  Skin: Skin is warm and dry. No rash noted. She is not  diaphoretic. No erythema. No pallor.  Scars from EN on feet  Psychiatric: She has a normal mood and affect. Her behavior is normal. Judgment and thought content normal.  Vitals reviewed.  Vitals:   02/13/17 1638  BP: 132/68  Pulse: 75  SpO2: 98%  Weight: 206 lb (93.4 kg)  Height: 5\' 1"  (1.549 m)       Assessment:     ICD-10-CM   1. Sarcoidosis D86.9 Basic Metabolic Panel (BMET)    Calcium    Hepatic function panel    DG Chest 2 View  2. Hypercalcemia E83.52   3. Erythema nodosum L52   4. Encounter for monitoring immunomodulating therapy Z51.81 Basic Metabolic Panel (BMET)   Z79.899 Calcium    Hepatic function panel         Plan:       Clinically asymptomatic Noted self-directed off prednisone due to subjective side effets and personal decision abou it  Plan Flu shot 02/13/2017 Check bmet, calciium, and LFT - monitor for methotrexate toxicity and sarcoid activity Get cxr 2 view if results are normal, could consider stopping methotrexate but given high calcium my inclindation is for you to continue so I want you to continue for now  Followup - will call with results  - return in 3-5  months to reassess   > 50% of this > 25 min visit spent in face to face counseling or coordination of care   Dr. Kalman Shan, M.D., [facility].C.P Pulmonary and Critical Care Medicine Staff Physician, [facility] Director - Interstitial Lung Disease  Program  Pulmonary Fibrosis [facility] at [facility] [place], [place], [address]  Pager: [phone], If no answer or between  15:00h - 7:00h: call [phone] Telephone: [phone]

## 2017-02-14 ENCOUNTER — Encounter: Payer: Self-pay | Admitting: Internal Medicine

## 2017-02-21 ENCOUNTER — Ambulatory Visit (INDEPENDENT_AMBULATORY_CARE_PROVIDER_SITE_OTHER)
Admission: RE | Admit: 2017-02-21 | Discharge: 2017-02-21 | Disposition: A | Discharge status: Home / Self Care | Payer: BC Managed Care – PPO | Source: Ambulatory Visit | Attending: Internal Medicine | Admitting: Internal Medicine

## 2017-02-21 ENCOUNTER — Other Ambulatory Visit (INDEPENDENT_AMBULATORY_CARE_PROVIDER_SITE_OTHER): Payer: BC Managed Care – PPO

## 2017-02-21 DIAGNOSIS — Z5181 Encounter for therapeutic drug level monitoring: Secondary | ICD-10-CM | POA: Diagnosis not present

## 2017-02-21 DIAGNOSIS — D869 Sarcoidosis, unspecified: Secondary | ICD-10-CM | POA: Diagnosis not present

## 2017-02-21 DIAGNOSIS — Z79899 Other long term (current) drug therapy: Secondary | ICD-10-CM

## 2017-02-21 LAB — BASIC METABOLIC PANEL
BUN: 12 mg/dL (ref 6–23)
CALCIUM: 9.4 mg/dL (ref 8.4–10.5)
CO2: 27 meq/L (ref 19–32)
Chloride: 106 mEq/L (ref 96–112)
Creatinine, Ser: 0.9 mg/dL (ref 0.40–1.20)
GFR: 85.25 mL/min (ref >60.00)
GLUCOSE: 76 mg/dL (ref 70–99)
Potassium: 4 mEq/L (ref 3.5–5.1)
SODIUM: 139 meq/L (ref 135–145)

## 2017-02-21 LAB — HEPATIC FUNCTION PANEL
ALT: 16 U/L (ref 0–35)
AST: 18 U/L (ref 0–37)
Albumin: 3.9 g/dL (ref 3.5–5.2)
Alkaline Phosphatase: 61 U/L (ref 39–117)
BILIRUBIN DIRECT: 0 mg/dL (ref 0.0–0.3)
BILIRUBIN TOTAL: 0.5 mg/dL (ref 0.2–1.2)
Total Protein: 7.3 g/dL (ref 6.0–8.3)

## 2017-03-19 ENCOUNTER — Other Ambulatory Visit: Payer: Self-pay | Admitting: Adult Health

## 2017-04-03 ENCOUNTER — Other Ambulatory Visit: Payer: Self-pay | Admitting: Family Medicine

## 2017-04-03 MED ORDER — OSELTAMIVIR PHOSPHATE 75 MG PO CAPS: 75.0000 mg | ORAL_CAPSULE | Freq: Two times a day (BID) | ORAL | 0 refills | Status: DC

## 2017-04-08 ENCOUNTER — Encounter: Payer: Self-pay | Admitting: Physician Assistant

## 2017-04-08 ENCOUNTER — Ambulatory Visit (INDEPENDENT_AMBULATORY_CARE_PROVIDER_SITE_OTHER): Payer: BC Managed Care – PPO

## 2017-04-08 ENCOUNTER — Ambulatory Visit: Payer: BC Managed Care – PPO | Admitting: Physician Assistant

## 2017-04-08 VITALS — BP 140/88 | HR 86 | Temp 98.1°F | Resp 16 | Ht 61.0 in | Wt 202.8 lb

## 2017-04-08 DIAGNOSIS — J069 Acute upper respiratory infection, unspecified: Secondary | ICD-10-CM | POA: Diagnosis not present

## 2017-04-08 DIAGNOSIS — D869 Sarcoidosis, unspecified: Secondary | ICD-10-CM

## 2017-04-08 MED ORDER — PREDNISONE 20 MG PO TABS: 20.0000 mg | ORAL_TABLET | Freq: Every day | ORAL | 0 refills | Status: DC

## 2017-04-08 MED ORDER — AMOXICILLIN-POT CLAVULANATE 875-125 MG PO TABS: 1.0000 | ORAL_TABLET | Freq: Two times a day (BID) | ORAL | 0 refills | Status: DC

## 2017-04-08 NOTE — Patient Instructions (Signed)
It was great to see you!  Please take medication as instructed. Drink plenty of water. If symptoms do not improve or worsen, please contact pulmonology or us.

## 2017-04-08 NOTE — Progress Notes (Signed)
Christina Poole is a 52 y.o. female here for a new problem.   History of Present Illness:   Chief Complaint  Patient presents with  . Nasal Congestion  . Cough    HPI   Patient presents with cough 3 weeks. She reports that her cough has progressively been getting worse over the past 3 weeks. Cough started out mucus is now with thick green mucus. Her son has had flulike symptoms. This patient has taken prophylactic Tamiflu and has more doses left. She had tolerated medicine well without any side effects. She is also currently taking DayQuil. She denies shortness of breath, chest pain, weakness. She endorses decreased appetite. She does not think that she is as hydrated as she should be. She does have a significant pulmonary history of sarcoidosis. She is on methotrexate. She reports that her son's fever has returned after he has completed Tamiflu so she is worried that there might be something else going on.  Patient sees Dr. Marchelle Gearing last seen in December 2018.   Past Medical History:  Diagnosis Date  . Colitis   . Sarcoidosis      Social History   Socioeconomic History  . Marital status: Married    Spouse name: Not on file  . Number of children: Not on file  . Years of education: Not on file  . Highest education level: Not on file  Social Needs  . Financial resource strain: Not on file  . Food insecurity - worry: Not on file  . Food insecurity - inability: Not on file  . Transportation needs - medical: Not on file  . Transportation needs - non-medical: Not on file  Occupational History  . Not on file  Tobacco Use  . Smoking status: Never Smoker  . Smokeless tobacco: Never Used  Substance and Sexual Activity  . Alcohol use: No  . Drug use: No  . Sexual activity: Yes    Birth control/protection: None  Other Topics Concern  . Not on file  Social History Narrative   From [place], went to [employer]    Married, two children, twins    Past Surgical  History:  Procedure Laterality Date  . APPENDECTOMY    . CESAREAN SECTION    . MYOMECTOMY      Family History  Problem Relation Age of Onset  . Colon cancer Father        in his 65s  . Diabetes Mother   . Hypercholesterolemia Mother     Allergies  Allergen Reactions  . Codeine     Severe unrelenting nausea    Current Medications:   Current Outpatient Medications:  .  amLODipine-benazepril (LOTREL) 5-20 MG capsule, Take 1 capsule by mouth daily., Disp: 90 capsule, Rfl: 0 .  folic acid (FOLVITE) 1 MG tablet, Take 1 tablet (1 mg total) by mouth daily., Disp: 30 tablet, Rfl: 3 .  methotrexate (RHEUMATREX) 2.5 MG tablet, TAKE 6 TABS EVERY FRIDAY, Disp: 30 tablet, Rfl: 5 .  Multiple Vitamins-Minerals (HAIR/SKIN/NAILS) CAPS, Take 2 capsules by mouth daily., Disp: , Rfl:  .  oseltamivir (TAMIFLU) 75 MG capsule, Take 1 capsule (75 mg total) by mouth 2 (two) times daily., Disp: 10 capsule, Rfl: 0   Review of Systems:   ROS  Vitals:   Vitals:   04/08/17 1611  BP: 140/88  Pulse: 86  Resp: 16  Temp: 98.1 F (36.7 C)  TempSrc: Oral  SpO2: 98%  Weight: 202 lb 12.8 oz (92 kg)  Height: 5\' 1"  (1.549 m)     Body mass index is 38.32 kg/m.  Physical Exam:   Physical Exam  Constitutional: She appears well-developed. She is cooperative.  Non-toxic appearance. She does not have a sickly appearance. She does not appear ill. No distress.  Cardiovascular: Normal rate, regular rhythm, S1 normal, S2 normal, normal heart sounds and normal pulses.  No LE edema  Pulmonary/Chest: Effort normal. No accessory muscle usage. No apnea, no tachypnea and no bradypnea. No respiratory distress. She has decreased breath sounds in the right lower field and the left lower field.  Decreased breath sounds throughout bilateral lungs, able to hear air movement. No crackles, wheezes.  Neurological: She is alert. GCS eye subscore is 4. GCS verbal subscore is 5. GCS motor subscore is 6.  Skin: Skin is warm,  dry and intact.  Psychiatric: She has a normal mood and affect. Her speech is normal and behavior is normal.  Nursing note and vitals reviewed.  CLINICAL DATA:  Three weeks of productive cough. Recent flu exposure. History of sarcoidosis. Nonsmoker.  EXAM: CHEST  2 VIEW  COMPARISON:  Chest x-ray of February 21, 2017  FINDINGS: The lungs are well-expanded. The interstitial markings are coarse but stable. There is no alveolar infiltrate or pleural effusion. The heart is top-normal in size. There tortuosity of the ascending and descending thoracic aorta. The trachea is midline. The bony thorax exhibits no acute abnormality.  IMPRESSION: No acute pneumonia nor CHF. Stable patchy interstitial densities bilaterally are compatible with known sarcoidosis.  Assessment and Plan:    Yoali was seen today for nasal congestion and cough.  Diagnoses and all orders for this visit:  Upper respiratory tract infection, unspecified type and Sarcoidosis Vital signs stable. Given prolonged duration of cough and history of sarcoidosis, we'll go ahead and start Augmentin antibiotic. We will also start a short course of low-dose prednisone per orders. I advised with her to follow-up with us for pulmonary if her symptoms do not improve or worsen. If there are any new symptoms she is to let us know immediately. Chest x-ray without evidence of pneumonia or CHF. Patient is agreeable to plan. -     DG Chest 2 View; Future  . Reviewed expectations re: course of current medical issues. . Discussed self-management of symptoms. . Outlined signs and symptoms indicating need for more acute intervention. . Patient verbalized understanding and all questions were answered. . See orders for this visit as documented in the electronic medical record. . Patient received an After-Visit Summary.  CMA or LPN served as scribe during this visit. History, Physical, and Plan performed by medical provider. Documentation  and orders reviewed and attested to.  Jarold MottoSamantha Sergey Ishler, PA-C

## 2017-04-12 ENCOUNTER — Telehealth: Payer: Self-pay | Admitting: *Deleted

## 2017-04-12 NOTE — Telephone Encounter (Signed)
Left message on voicemail to call office. Please tell pt to hold Methotrexate while on antibiotic per Jarold MottoSamantha Worley.

## 2017-04-12 NOTE — Telephone Encounter (Signed)
Contacted pt and gave her the message per [name], "Please tell pt to hold Methotrexate while on antibiotic"; she verbalizes understanding; pt also states that she needs a note for work and would like for it to be left at the desk for her;she will be there after 1500 today to pick it up; will route this to [facility] for notification of pt request; the pt can be contacted at [phone].

## 2017-04-12 NOTE — Telephone Encounter (Signed)
Left message on voicemail to call office. Need to know what days the note is for?

## 2017-04-24 ENCOUNTER — Encounter: Payer: Self-pay | Admitting: Physician Assistant

## 2017-04-24 ENCOUNTER — Other Ambulatory Visit: Payer: Self-pay | Admitting: *Deleted

## 2017-04-24 ENCOUNTER — Ambulatory Visit: Payer: BC Managed Care – PPO | Admitting: Physician Assistant

## 2017-04-24 VITALS — BP 130/90 | HR 80 | Temp 98.4°F | Ht 61.0 in | Wt 201.0 lb

## 2017-04-24 DIAGNOSIS — J069 Acute upper respiratory infection, unspecified: Secondary | ICD-10-CM

## 2017-04-24 MED ORDER — IPRATROPIUM BROMIDE 0.03 % NA SOLN: 2.0000 | Freq: Two times a day (BID) | NASAL | 2 refills | Status: DC

## 2017-04-24 MED ORDER — METHOTREXATE 2.5 MG PO TABS: ORAL_TABLET | ORAL | 0 refills | Status: DC

## 2017-04-24 NOTE — Progress Notes (Signed)
Christina Poole is a 52 y.o. female here for a new problem.   I am acting as a Neurosurgeon for [employer], PA-C Corky Mull, LPN  History of Present Illness:   Chief Complaint  Patient presents with  . Sinus Problem    Sinus Problem  This is a new problem. Episode onset: Pt just finished antibiotics for URI and now has sinus problem. The problem has been gradually worsening since onset. There has been no fever. Her pain is at a severity of 1/10. The pain is mild. Associated symptoms include chills, congestion, headaches, a hoarse voice and sneezing. Pertinent negatives include no ear pain, neck pain, shortness of breath or swollen glands. (Pt has slight cough, head feels stuffy. Nasal drainage thick yellow / green drainage.) Treatments tried: Dayquil. The treatment provided no relief.   Doesn't feel like she's pushing enough fluids. Reports that her head continues to feel stuffy. Has not tried much OTC medications other than Dayquil and Flonase.  Past Medical History:  Diagnosis Date  . Colitis   . Sarcoidosis      Social History   Socioeconomic History  . Marital status: Married    Spouse name: Not on file  . Number of children: Not on file  . Years of education: Not on file  . Highest education level: Not on file  Social Needs  . Financial resource strain: Not on file  . Food insecurity - worry: Not on file  . Food insecurity - inability: Not on file  . Transportation needs - medical: Not on file  . Transportation needs - non-medical: Not on file  Occupational History  . Not on file  Tobacco Use  . Smoking status: Never Smoker  . Smokeless tobacco: Never Used  Substance and Sexual Activity  . Alcohol use: No  . Drug use: No  . Sexual activity: Yes    Birth control/protection: None  Other Topics Concern  . Not on file  Social History Narrative   From [place], went to [employer]    Married, two children, twins    Past Surgical History:  Procedure  Laterality Date  . APPENDECTOMY    . CESAREAN SECTION    . MYOMECTOMY      Family History  Problem Relation Age of Onset  . Colon cancer Father        in his 45s  . Diabetes Mother   . Hypercholesterolemia Mother     Allergies  Allergen Reactions  . Codeine     Severe unrelenting nausea    Current Medications:   Current Outpatient Medications:  .  amLODipine-benazepril (LOTREL) 5-20 MG capsule, Take 1 capsule by mouth daily., Disp: 90 capsule, Rfl: 0 .  folic acid (FOLVITE) 1 MG tablet, Take 1 tablet (1 mg total) by mouth daily., Disp: 30 tablet, Rfl: 3 .  methotrexate (RHEUMATREX) 2.5 MG tablet, TAKE 6 TABS EVERY FRIDAY, Disp: 90 tablet, Rfl: 0 .  Multiple Vitamins-Minerals (HAIR/SKIN/NAILS) CAPS, Take 2 capsules by mouth daily., Disp: , Rfl:  .  ipratropium (ATROVENT) 0.03 % nasal spray, Place 2 sprays into both nostrils every 12 (twelve) hours., Disp: 30 mL, Rfl: 2   Review of Systems:   Review of Systems  Constitutional: Positive for chills.  HENT: Positive for congestion, hoarse voice and sneezing. Negative for ear pain.   Respiratory: Negative for shortness of breath.   Musculoskeletal: Negative for neck pain.  Neurological: Positive for headaches.    Vitals:   Vitals:  04/24/17 1612  BP: 130/90  Pulse: 80  Temp: 98.4 F (36.9 C)  TempSrc: Oral  SpO2: 98%  Weight: 201 lb (91.2 kg)  Height: 5\' 1"  (1.549 m)     Body mass index is 37.98 kg/m.  Physical Exam:   Physical Exam  Constitutional: She appears well-developed. She is cooperative.  Non-toxic appearance. She does not have a sickly appearance. She does not appear ill. No distress.  HENT:  Head: Normocephalic and atraumatic.  Right Ear: Tympanic membrane, external ear and ear canal normal. Tympanic membrane is not erythematous, not retracted and not bulging.  Left Ear: Tympanic membrane, external ear and ear canal normal. Tympanic membrane is not erythematous, not retracted and not bulging.   Nose: Mucosal edema and rhinorrhea present. Right sinus exhibits no maxillary sinus tenderness and no frontal sinus tenderness. Left sinus exhibits no maxillary sinus tenderness and no frontal sinus tenderness.  Mouth/Throat: Uvula is midline and mucous membranes are normal. Posterior oropharyngeal erythema present. No posterior oropharyngeal edema. Tonsils are 1+ on the right. Tonsils are 1+ on the left. No tonsillar exudate.  Eyes: Conjunctivae and lids are normal.  Neck: Trachea normal.  Cardiovascular: Normal rate, regular rhythm, S1 normal, S2 normal and normal heart sounds.  Pulmonary/Chest: Effort normal and breath sounds normal. She has no decreased breath sounds. She has no wheezes. She has no rhonchi. She has no rales.  Good breath sounds bilaterally  Lymphadenopathy:    She has no cervical adenopathy.  Neurological: She is alert.  Skin: Skin is warm, dry and intact.  Psychiatric: She has a normal mood and affect. Her speech is normal and behavior is normal.  Nursing note and vitals reviewed.   Assessment and Plan:    Abisai was seen today for sinus problem.  Diagnoses and all orders for this visit:  Upper respiratory tract infection, unspecified type  Other orders -     ipratropium (ATROVENT) 0.03 % nasal spray; Place 2 sprays into both nostrils every 12 (twelve) hours.   No red flags on exam.  Will initiate Atrovent Nasal Spray, Coricidin Cough Medicine or Mucinex per orders. Try Neti-Pot. Use humidifier. We briefly discussed initiation of second antibiotic, perhaps Levaquin, or another round of steroids, however she declined both of these at this time. Discussed taking medications as prescribed. Reviewed return precautions including worsening fever, SOB, worsening cough or other concerns. Push fluids and rest. I recommend that patient follow-up if symptoms worsen or persist despite treatment x 7-10 days, sooner if needed.   . Reviewed expectations re: course of current  medical issues. . Discussed self-management of symptoms. . Outlined signs and symptoms indicating need for more acute intervention. . Patient verbalized understanding and all questions were answered. . See orders for this visit as documented in the electronic medical record. . Patient received an After-Visit Summary.  CMA or LPN served as scribe during this visit. History, Physical, and Plan performed by medical provider. Documentation and orders reviewed and attested to.  Jarold MottoSamantha Townsend C[place], PA-C

## 2017-04-24 NOTE — Patient Instructions (Addendum)
I was good to see you.  Push fluids! Try to use a Neti-Pot. Start Coricidin and use the nasal spray that I have sent in for you.  Follow-up if symptoms worsen or persist.

## 2017-04-25 ENCOUNTER — Encounter: Payer: Self-pay | Admitting: Physician Assistant

## 2017-06-13 ENCOUNTER — Encounter: Payer: Self-pay | Admitting: Physician Assistant

## 2017-06-17 ENCOUNTER — Encounter: Payer: Self-pay | Admitting: *Deleted

## 2017-06-24 ENCOUNTER — Ambulatory Visit: Payer: BC Managed Care – PPO | Admitting: Physician Assistant

## 2017-06-24 ENCOUNTER — Encounter: Payer: Self-pay | Admitting: Physician Assistant

## 2017-06-24 VITALS — BP 130/88 | HR 78 | Temp 97.8°F | Ht 61.0 in | Wt 200.2 lb

## 2017-06-24 DIAGNOSIS — J069 Acute upper respiratory infection, unspecified: Secondary | ICD-10-CM

## 2017-06-24 DIAGNOSIS — D869 Sarcoidosis, unspecified: Secondary | ICD-10-CM

## 2017-06-24 MED ORDER — AMOXICILLIN-POT CLAVULANATE 875-125 MG PO TABS: 1.0000 | ORAL_TABLET | Freq: Two times a day (BID) | ORAL | 0 refills | Status: DC

## 2017-06-24 NOTE — Patient Instructions (Signed)
It was great to see you!  Use medication as prescribed: Augmentin  HOLD YOUR METHOTREXATE WHILE ON THE ANTIBIOTIC  Push fluids and get plenty of rest. Please return if you are not improving as expected, or if you have high fevers (>101.5) or difficulty swallowing or worsening productive cough.  Call clinic with questions.  I hope you start feeling better soon!

## 2017-06-24 NOTE — Progress Notes (Signed)
Christina Poole is a 52 y.o. female here for a new problem.  I acted as a Neurosurgeon for [employer], PA-C Corky Mull, LPN  History of Present Illness:   Chief Complaint  Patient presents with  . Sinus Problem    Sinus Problem  This is a new problem. Episode onset: Pt said started again yesterday but has been off and on past 2 months. The problem has been gradually worsening since onset. There has been no fever. Associated symptoms include congestion (Nasal, green drainage). Pertinent negatives include no chills, coughing, ear pain, headaches, hoarse voice, neck pain, sinus pressure, sneezing or sore throat. Past treatments include spray decongestants (Mucinex). The treatment provided mild relief.   Worsening symptoms over the past few days. Felt like she got better and then symptoms returned.   Has scheduled follow-up with pulmonary on Friday, May 2. Appetite is good at this time. Denies SOB.   Past Medical History:  Diagnosis Date  . Colitis   . Sarcoidosis      Social History   Socioeconomic History  . Marital status: Married    Spouse name: Not on file  . Number of children: Not on file  . Years of education: Not on file  . Highest education level: Not on file  Occupational History  . Not on file  Social Needs  . Financial resource strain: Not on file  . Food insecurity:    Worry: Not on file    Inability: Not on file  . Transportation needs:    Medical: Not on file    Non-medical: Not on file  Tobacco Use  . Smoking status: Never Smoker  . Smokeless tobacco: Never Used  Substance and Sexual Activity  . Alcohol use: No  . Drug use: No  . Sexual activity: Yes    Birth control/protection: None  Lifestyle  . Physical activity:    Days per week: Not on file    Minutes per session: Not on file  . Stress: Not on file  Relationships  . Social connections:    Talks on phone: Not on file    Gets together: Not on file    Attends religious service: Not on  file    Active member of club or organization: Not on file    Attends meetings of clubs or organizations: Not on file    Relationship status: Not on file  . Intimate partner violence:    Fear of current or ex partner: Not on file    Emotionally abused: Not on file    Physically abused: Not on file    Forced sexual activity: Not on file  Other Topics Concern  . Not on file  Social History Narrative   From [place], went to [employer]    Married, two children, twins    Past Surgical History:  Procedure Laterality Date  . APPENDECTOMY    . CESAREAN SECTION    . MYOMECTOMY      Family History  Problem Relation Age of Onset  . Colon cancer Father        in his 79s  . Diabetes Mother   . Hypercholesterolemia Mother     Allergies  Allergen Reactions  . Codeine     Severe unrelenting nausea    Current Medications:   Current Outpatient Medications:  .  amLODipine-benazepril (LOTREL) 5-20 MG capsule, Take 1 capsule by mouth daily., Disp: 90 capsule, Rfl: 0 .  folic acid (FOLVITE) 1 MG tablet,  Take 1 tablet (1 mg total) by mouth daily., Disp: 30 tablet, Rfl: 3 .  GLUCOSAMINE-CHONDROITIN PO, Take 3-5 capsules by mouth daily., Disp: , Rfl:  .  ipratropium (ATROVENT) 0.03 % nasal spray, Place 2 sprays into both nostrils every 12 (twelve) hours., Disp: 30 mL, Rfl: 2 .  methotrexate (RHEUMATREX) 2.5 MG tablet, TAKE 6 TABS EVERY FRIDAY, Disp: 90 tablet, Rfl: 0 .  Multiple Vitamins-Minerals (HAIR/SKIN/NAILS) CAPS, Take 2 capsules by mouth daily., Disp: , Rfl:  .  amoxicillin-clavulanate (AUGMENTIN) 875-125 MG tablet, Take 1 tablet by mouth 2 (two) times daily., Disp: 20 tablet, Rfl: 0   Review of Systems:   Review of Systems  Constitutional: Negative for chills.  HENT: Positive for congestion (Nasal, green drainage). Negative for ear pain, hoarse voice, sinus pressure, sneezing and sore throat.   Respiratory: Negative for cough.   Musculoskeletal: Negative for neck  pain.  Neurological: Negative for headaches.    Vitals:   Vitals:   06/24/17 1559  BP: 130/88  Pulse: 78  Temp: 97.8 F (36.6 C)  TempSrc: Oral  SpO2: 97%  Weight: 200 lb 4 oz (90.8 kg)  Height:  (1.549 m)     Body mass index is 37.84 kg/m.  Physical Exam:   Physical Exam  Constitutional: She appears well-developed. She is cooperative.  Non-toxic appearance. She does not have a sickly appearance. She does not appear ill. No distress.  HENT:  Head: Normocephalic and atraumatic.  Right Ear: Tympanic membrane, external ear and ear canal normal. Tympanic membrane is not erythematous, not retracted and not bulging.  Left Ear: Tympanic membrane, external ear and ear canal normal. Tympanic membrane is not erythematous, not retracted and not bulging.  Nose: Mucosal edema and rhinorrhea present. Right sinus exhibits no maxillary sinus tenderness and no frontal sinus tenderness. Left sinus exhibits no maxillary sinus tenderness and no frontal sinus tenderness.  Mouth/Throat: Uvula is midline and mucous membranes are normal. Posterior oropharyngeal erythema present. No posterior oropharyngeal edema. Tonsils are 1+ on the right. Tonsils are 1+ on the left.  Eyes: Conjunctivae and lids are normal.  Neck: Trachea normal.  Cardiovascular: Normal rate, regular rhythm, S1 normal, S2 normal and normal heart sounds.  Pulmonary/Chest: Effort normal and breath sounds normal. She has no decreased breath sounds. She has no wheezes. She has no rhonchi. She has no rales.  Lungs clear to auscultation bilaterally  Lymphadenopathy:    She has no cervical adenopathy.  Neurological: She is alert.  Skin: Skin is warm, dry and intact.  Psychiatric: She has a normal mood and affect. Her speech is normal and behavior is normal.  Nursing note and vitals reviewed.   Assessment and Plan:    Christina Poole was seen today for sinus problem.  Diagnoses and all orders for this visit:  Upper respiratory tract  infection, unspecified type  Sarcoidosis  Other orders -     amoxicillin-clavulanate (AUGMENTIN) 875-125 MG tablet; Take 1 tablet by mouth 2 (two) times daily.   No red flags on exam.  Will initiate Augmentin per orders. Hold methotrexate while on this medication.  Discussed taking medications as prescribed. Reviewed return precautions including worsening fever, SOB, worsening cough or other concerns. Push fluids and rest. I recommend that patient follow-up if symptoms worsen or persist despite treatment x 7-10 days, sooner if needed. I will defer to pulmonology on any further imaging. We briefly discussed steroids however patient declined at this time.  . Reviewed expectations re: course of current medical issues. Marland Kitchen  Discussed self-management of symptoms. . Outlined signs and symptoms indicating need for more acute intervention. . Patient verbalized understanding and all questions were answered. . See orders for this visit as documented in the electronic medical record. . Patient received an After-Visit Summary.  CMA or LPN served as scribe during this visit. History, Physical, and Plan performed by medical provider. Documentation and orders reviewed and attested to.  Jarold Motto, PA-C

## 2017-06-26 ENCOUNTER — Ambulatory Visit: Payer: Self-pay

## 2017-06-26 ENCOUNTER — Encounter: Payer: Self-pay | Admitting: Sports Medicine

## 2017-06-26 ENCOUNTER — Ambulatory Visit (INDEPENDENT_AMBULATORY_CARE_PROVIDER_SITE_OTHER): Payer: BC Managed Care – PPO | Admitting: Sports Medicine

## 2017-06-26 VITALS — BP 122/84 | HR 86 | Ht 61.0 in | Wt 197.8 lb

## 2017-06-26 DIAGNOSIS — M25461 Effusion, right knee: Secondary | ICD-10-CM | POA: Diagnosis not present

## 2017-06-26 DIAGNOSIS — M25562 Pain in left knee: Principal | ICD-10-CM

## 2017-06-26 DIAGNOSIS — G8929 Other chronic pain: Secondary | ICD-10-CM

## 2017-06-26 DIAGNOSIS — M25561 Pain in right knee: Principal | ICD-10-CM

## 2017-06-26 NOTE — Progress Notes (Signed)

## 2017-06-26 NOTE — Patient Instructions (Signed)
You had an injection today.  Things to be aware of after injection are listed below: . You may experience no significant improvement or even a slight worsening in your symptoms during the first 24 to 48 hours.  After that we expect your symptoms to improve gradually over the next 2 weeks for the medicine to have its maximal effect.  You should continue to have improvement out to 6 weeks after your injection. . Dr. Berline Chough recommends icing the site of the injection for 20 minutes  1-2 times the day of your injection . You may shower but no swimming, tub bath or Jacuzzi for 24 hours. . If your bandage falls off this does not need to be replaced.  It is appropriate to remove the bandage after 4 hours. . You may resume light activities as tolerated unless otherwise directed per Dr. Berline Chough during your visit  POSSIBLE STEROID SIDE EFFECTS:  Side effects from injectable steroids tend to be less than when taken orally however you may experience some of the symptoms listed below.  If experienced these should only last for a short period of time. Change in menstrual flow  Edema (swelling)  Increased appetite Skin flushing (redness)  Skin rash/acne  Thrush (oral) Yeast vaginitis    Increased sweating  Depression Increased blood glucose levels Cramping and leg/calf  Euphoria (feeling happy)  POSSIBLE PROCEDURE SIDE EFFECTS: The side effects of the injection are usually fairly minimal however if you may experience some of the following side effects that are usually self-limited and will is off on their own.  If you are concerned please feel free to call the office with questions:  Increased numbness or tingling  Nausea or vomiting  Swelling or bruising at the injection site   Please call our office if if you experience any of the following symptoms over the next week as these can be signs of infection:   Fever greater than 100.46F  Significant swelling at the injection site  Significant redness or drainage  from the injection site  If after 2 weeks you are continuing to have worsening symptoms please call our office to discuss what the next appropriate actions should be including the potential for a return office visit or other diagnostic testing.    Please perform the exercise program that we have prepared for you and gone over in detail on a daily basis.  In addition to the handout you were provided you can access your program through: [email or web address]   Your unique program code is:  [facility]

## 2017-06-26 NOTE — Progress Notes (Signed)
  Christina Poole. Christina Poole Sports Medicine [facility] [phone]  Christina Poole - 52 y.o. female MRN [id number]  Date of birth: 1966/02/22  Visit Date: 06/26/2017  PCP: Jarold Motto, PA   Referred by: Jarold Motto, [place]  Scribe for today's visit: Stevenson Clinch, CMA     SUBJECTIVE:  Christina Poole is here for Initial Assessment (bilateral knee pain, L>R)  Her bilateral knee pain knee pain symptoms INITIALLY: Began over a year ago and MOI is unknown.  Described as mild throbbing, non-radiating. Pain is mostly on the lateral aspect of both knees.  Worsened with stairs Improved with rest. Additional associated symptoms include: She reports swelling and stiffness in both knees. L knee pain began first and the R knee pain developed. She feels that the swelling is starting to move into the lateral aspect of the thigh.     At this time symptoms show no change compared to onset  She has been taking glucosamine-chondrotin with some relief. She has tried Tylenol and Aleve with minimal relief. She does get some relief from icing and doing accupuncture.   ROS Denies night time disturbances. Denies fevers, chills, or night sweats. Denies unexplained weight loss. Denies personal history of cancer. Denies changes in bowel or bladder habits. Denies recent unreported falls. Denies new or worsening dyspnea or wheezing. Denies headaches or dizziness.  Denies numbness, tingling or weakness  In the extremities.  Denies dizziness or presyncopal episodes Reports lower extremity edema    HISTORY & PERTINENT PRIOR DATA:  Prior History reviewed and updated per electronic medical record.  Significant/pertinent history, findings, studies include:  reports that she has never smoked. She has never used smokeless tobacco. No results for input(s): HGBA1C, LABURIC, CREATINE in the last 8760 hours. No specialty comments available. No problems  updated.  OBJECTIVE:  VS:  HT:5\' 1"  (154.9 cm)   WT:197 lb 12.8 oz (89.7 kg)  BMI:37.39    BP:122/84  HR:86bpm  TEMP: ( )  RESP:97 %   PHYSICAL EXAM: Constitutional: WDWN, Non-toxic appearing. Psychiatric: Alert & appropriately interactive.  Not depressed or anxious appearing. Respiratory: No increased work of breathing.  Trachea Midline Eyes: Pupils are equal.  EOM intact without nystagmus.  No scleral icterus  Vascular Exam: warm to touch no edema  lower extremity neuro exam: unremarkable  MSK Exam: Knee is overall well aligned without significant deformity.  Moderate effusion.  Ligamentously stable.  Limited flexion extension due to the effusion.  Medial and lateral joint line.   ASSESSMENT & PLAN:   1. Chronic pain of both knees   2. Effusion of right knee     PLAN: Aspiration injection today.  Discussed the foundation of treatment for this condition is physical therapy and/or daily (5-6 days/week) therapeutic exercises, focusing on core strengthening, coordination, neuromuscular control/reeducation.  Therapeutic exercises prescribed per procedure note.    Follow-up: Return in about 6 weeks (around 08/07/2017).      Please see additional documentation for Objective, Assessment and Plan sections. Pertinent additional documentation may be included in corresponding procedure notes, imaging studies, problem based documentation and patient instructions. Please see these sections of the encounter for additional information regarding this visit.  CMA/ATC served as Neurosurgeon during this visit. History, Physical, and Plan performed by medical provider. Documentation and orders reviewed and attested to.      Christina Mews, DO    [facility]

## 2017-06-26 NOTE — Procedures (Signed)
PROCEDURE NOTE:  Ultrasound Guided: Injection: Left knee Images were obtained and interpreted by myself, Gaspar Bidding, DO  Images have been saved and stored to PACS system. Images obtained on: GE S7 Ultrasound machine    ULTRASOUND FINDINGS:  marked synovitis. large effusion  DESCRIPTION OF PROCEDURE:  The patient's clinical condition is marked by substantial pain and/or significant functional disability. Other conservative therapy has not provided relief, is contraindicated, or not appropriate. There is a reasonable likelihood that injection will significantly improve the patient's pain and/or functional impairment.   After discussing the risks, benefits and expected outcomes of the injection and all questions were reviewed and answered, the patient wished to undergo the above named procedure.  Verbal consent was obtained.  The ultrasound was used to identify the target structure and adjacent neurovascular structures. The skin was then prepped in sterile fashion and the target structure was injected under direct visualization using sterile technique as below:  PREP: Alcohol, Ethel Chloride and 5 cc 1% lidocaine on 25g 1.5 in. needle APPROACH: superiolateral, single injection, 18g 1.5 in. INJECTATE: 2 cc 0.5% Marcaine and 2 cc /mL DepoMedrol ASPIRATE: 135cc serous fluid. set fir cell count and crystals DRESSING: Band-Aid  Post procedural instructions including recommending icing and warning signs for infection were reviewed.    This procedure was well tolerated and there were no complications.   IMPRESSION: Succesful Ultrasound Guided: Injection

## 2017-06-27 LAB — SYNOVIAL CELL COUNT + DIFF, W/ CRYSTALS
Basophils, %: 0 %
Eosinophils-Synovial: 0 % (ref 0–2)
Lymphocytes-Synovial Fld: 85 % — ABNORMAL HIGH (ref 0–74)
Monocyte/Macrophage: 10 % (ref 0–69)
Neutrophil, Synovial: 5 % (ref 0–24)
SYNOVIOCYTES, %: 0 % (ref 0–15)
WBC, Synovial: 2200 cells/uL — ABNORMAL HIGH (ref <150)

## 2017-06-28 ENCOUNTER — Other Ambulatory Visit (INDEPENDENT_AMBULATORY_CARE_PROVIDER_SITE_OTHER): Payer: BC Managed Care – PPO

## 2017-06-28 ENCOUNTER — Encounter: Payer: Self-pay | Admitting: Internal Medicine

## 2017-06-28 ENCOUNTER — Ambulatory Visit (INDEPENDENT_AMBULATORY_CARE_PROVIDER_SITE_OTHER): Payer: BC Managed Care – PPO | Admitting: Internal Medicine

## 2017-06-28 VITALS — BP 124/72 | HR 75 | Ht 61.0 in | Wt 197.2 lb

## 2017-06-28 DIAGNOSIS — Z5181 Encounter for therapeutic drug level monitoring: Secondary | ICD-10-CM

## 2017-06-28 DIAGNOSIS — D869 Sarcoidosis, unspecified: Secondary | ICD-10-CM

## 2017-06-28 DIAGNOSIS — Z79899 Other long term (current) drug therapy: Secondary | ICD-10-CM

## 2017-06-28 DIAGNOSIS — L52 Erythema nodosum: Secondary | ICD-10-CM

## 2017-06-28 LAB — CBC WITH DIFFERENTIAL/PLATELET
BASOS ABS: 0.1 10*3/uL (ref 0.0–0.1)
Basophils Relative: 1.3 % (ref 0.0–3.0)
EOS PCT: 1.8 % (ref 0.0–5.0)
Eosinophils Absolute: 0.2 10*3/uL (ref 0.0–0.7)
HCT: 34.1 % — ABNORMAL LOW (ref 36.0–46.0)
Hemoglobin: 11.2 g/dL — ABNORMAL LOW (ref 12.0–15.0)
LYMPHS ABS: 2.9 10*3/uL (ref 0.7–4.0)
LYMPHS PCT: 24.7 % (ref 12.0–46.0)
MCHC: 32.8 g/dL (ref 30.0–36.0)
MCV: 93.7 fl (ref 78.0–100.0)
MONOS PCT: 5.3 % (ref 3.0–12.0)
Monocytes Absolute: 0.6 10*3/uL (ref 0.1–1.0)
NEUTROS PCT: 66.9 % (ref 43.0–77.0)
Neutro Abs: 7.9 10*3/uL — ABNORMAL HIGH (ref 1.4–7.7)
Platelets: 377 10*3/uL (ref 150.0–400.0)
RBC: 3.64 Mil/uL — AB (ref 3.87–5.11)
RDW: 14.3 % (ref 11.5–15.5)
WBC: 11.7 10*3/uL — ABNORMAL HIGH (ref 4.0–10.5)

## 2017-06-28 LAB — HEPATIC FUNCTION PANEL
ALBUMIN: 3.8 g/dL (ref 3.5–5.2)
ALK PHOS: 71 U/L (ref 39–117)
ALT: 15 U/L (ref 0–35)
AST: 14 U/L (ref 0–37)
BILIRUBIN DIRECT: 0 mg/dL (ref 0.0–0.3)
TOTAL PROTEIN: 7.5 g/dL (ref 6.0–8.3)
Total Bilirubin: 0.4 mg/dL (ref 0.2–1.2)

## 2017-06-28 LAB — BASIC METABOLIC PANEL
BUN: 17 mg/dL (ref 6–23)
CALCIUM: 9.4 mg/dL (ref 8.4–10.5)
CO2: 29 mEq/L (ref 19–32)
CREATININE: 0.85 mg/dL (ref 0.40–1.20)
Chloride: 107 mEq/L (ref 96–112)
GFR: 90.93 mL/min (ref >60.00)
Glucose, Bld: 84 mg/dL (ref 70–99)
POTASSIUM: 4.2 meq/L (ref 3.5–5.1)
Sodium: 142 mEq/L (ref 135–145)

## 2017-06-28 LAB — CALCIUM: Calcium: 9.4 mg/dL (ref 8.4–10.5)

## 2017-06-28 NOTE — Patient Instructions (Signed)
ICD-10-CM   1. Sarcoidosis D86.9   2. Hypercalcemia E83.52   3. Erythema nodosum L52   4. Encounter for monitoring immunomodulating therapy Z51.81    Z79.899     Clinically sarcoid under remission Sinus congestion due to pollen and per PCP  PLAN  no need for cxr Do cbc, bmet, lft and calcium 06/28/2017 - will call with results Continue methotrexate  weekly (you have completed 1 year) - wil continue another 6 months and reassess  Followup Return in 6 months or sooner if needed

## 2017-06-28 NOTE — Progress Notes (Signed)
Subjective:     Patient ID: Christina Poole, female   DOB: 10-19-1965, 52 y.o.   MRN: [id number]  HPI  PCP No PCP Per Patient Gastro Dr. Dulce Sellar Dermatologist Dr. Arlys John sSTrauss    HPI   IOV 09/30/2015  Chief Complaint  Patient presents with  . Advice Only    Referred for abn cxr, sarcoidosis.  cxr in Epic. Denies any breathing complaints currently.      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 [name] to [place] area approximately 2 years ago. She has inflammatory bowel disease and is under the care of Dr. Chrissie Noa outlaw at [facility] and his been on Remicade for the last several years every 2 months. She has not had a single flareup and being on Remicade. A year ago she started noticing skin lesions in bilateral shin that did not resolve. Therefore a few months ago she saw Dr. Arlys John Straussdermatologist was subjected to biopsy which confirmed sarcoidosis. For the last 3 weeks, after failing topical therapy, she's been on prednisone 5 mg every other day and this has helped the skin lesions. She's not having any side effects from the prednisone. There is concern that she might have pulmonary sarcoidosis because on 08/23/2015 she had a cardiac echocardiogram that was normal and a chest x-ray that is reported as abnormal. I personally visualized this chest x-ray and noticed hilar adenopathy and possible interstitial infiltrates. She says that these tests were done as part of workup related to her frozen embryos she did not want to detail any further. She says at baseline she walks 3 miles every few days and is not symptomatic. She denies any chest pain, cough, dyspnea, hemoptysis, night sweats, chills.   Echocardiogram 08/23/2015: Normal ejection fraction 65%. Right-sided pressures are normal. Pulmonary artery pressure is reported as normal.  02/12/2014 blood work: Creatinine 0.8 mg percent hemoglobin 13 g percent.   D/w patient: call 8?8/17 - CT findings are typical of  sarcoid - - Ddx is opportunistic infection due to TNFalpha blockade. she is asymptomatic and continues to be so. PFT normal  Based on above  - discussed monuitoring v bronch/bal/tbbx (low yield);' she opted to monitor without bronch  Plan  - cancel mid sept 2017 ov - change fu to 3 months from 10/27/2015  - get spiro and dlco (no lung volume, no bd response) in 3 months prior to next OV  Thanks    09/07/2016 Follow up : Sarcoid  Pt returns for 9 month follow up for Sarcoid  Moved to [place] briefly and seen by Pulmonary . She is getting copy of CT chest that was done.  Remains on Methotrexate (6 tabs /wk)  And Prednisone  daily  . She has skin and pulmonary involvement . Her Leg lesions are better but not gone.  No cough or dyspnea, hemoptysis .   Reports Calcium level was elevated and was given steroid burst over last month , now on  daily.   IBD is well controlled, has been taken off Remicade currently .    11/08/2016 Follow up : Sarcoid  Pt returns for 3 month follow up for Sarcoid  With skin and pulmonary involvement.  Remains on prednisone  daily  And Methotrexate 6 tabs/wk .  Says she is doing okay until 2 weeks . She developed cough, congestion , thick mucus  , nasal drainage and congestion . No fever , hemoptysis , chest pain, orthopnea or edema.  Using Mucinex DM. On ACE , denies cough until 2  weeks ago.   OV 02/13/2017  Chief Complaint  Patient presents with  . Follow-up    follow up from PFT- results/POC   I personally have not seen Christina Poole in almost a year and a half.  She had moved in August 2017 to [place] and has now relocated again to [facility] in June 2018.  While in [place] she establish herself with a pulmonologist for her sarcoidosis.  Apparently had a hard time getting hold of her high-resolution CT scan of the chest.  Of note she was diagnosed with sarcoidosis based on erythema nodosum and high angiotensin converting enzyme  levels.  She was always asymptomatic from a respiratory standpoint but when we did a high-resolution CT scan of the chest in August 2017 she had pulmonary infiltrates [she was on Remicade at that time for colitis for 10 years; she stopped this in 2017 when she moved to [place] and is not had any problems with colitis since].  She tells me that while she was in [place] she was diagnosed with hypercalcemia presumably due to sarcoidosis.  She was committed to methotrexate and prednisone.  She only started methotrexate in June 2018 and prednisone 1 year ago.  But she is off the prednisone now because of side effects in the last few weeks.  She slowly tapered and stopped.  She continues methotrexate at a very low dose of 5 mg once weekly.  Her most recent calcium in [facility] [place] in July 2018 is normal.  She continues to be asymptomatic from a respiratory standpoint.  Her pulmonary function test today is normal.  The erythema nodosum in her feet have essentially scarred down but you can still see it.  There is no skin lesions in the face or eye or palpitation issues.  She not known to have any renal sarcoidosis.   OV 06/28/2017  Chief Complaint  Patient presents with  . Follow-up    Pt has some congestion going on and was put on an abx Monday and is currently still taking it. Has c/o cough with occ. green mucus. Denies any CP or SOB.   Follow-up sarcoidosis associated with hypercalcemia and erythema nodosum-on methotrexate since June 2018 started in [place]  Is a 61-month follow-up.  She continues to do well.  She is now completing nearly 1 year of being on methotrexate without prednisone.  She does not have any side effects on the methotrexate.  From a respiratory standpoint she is doing well.  Her erythema nodosum lesions are slowly dissipating.  Only scars and skin pigmented lesions are present.  There is no palpitations or eye issues or any renal issues.  Last check calcium in December 2018  and liver function test was normal.  She tells me that she is unaware of the methotrexate side effects of liver injury and pulmonary injury.  Nevertheless she is on low-dose methotrexate.  She was educated about her side effects.  She is willing to continue this for some more time.  The only new issue is that in the last 10 days she has had sinus congestion due to pollen and she is on antibiotics per primary care physician.  It is slowly getting better.  There is no fever chills or wheezing or sputum production    has a past medical history of Colitis and Sarcoidosis.   reports that she has never smoked. She has never used smokeless tobacco.  Past Surgical History:  Procedure Laterality Date  . APPENDECTOMY    .  CESAREAN SECTION    . MYOMECTOMY      Allergies  Allergen Reactions  . Codeine     Severe unrelenting nausea    Immunization History  Administered Date(s) Administered  . Influenza Split 12/31/2014  . Influenza,inj,Quad PF,6+ Mos 02/13/2017  . Influenza-Unspecified 12/03/2015  . PPD Test 09/28/2016  . Tdap 09/28/2016    Family History  Problem Relation Age of Onset  . Colon cancer Father        in his 8s  . Diabetes Mother   . Hypercholesterolemia Mother      Current Outpatient Medications:  .  amLODipine-benazepril (LOTREL) 5-20 MG capsule, Take 1 capsule by mouth daily., Disp: 90 capsule, Rfl: 0 .  amoxicillin-clavulanate (AUGMENTIN) 875-125 MG tablet, Take 1 tablet by mouth 2 (two) times daily., Disp: 20 tablet, Rfl: 0 .  folic acid (FOLVITE) 1 MG tablet, Take 1 tablet (1 mg total) by mouth daily., Disp: 30 tablet, Rfl: 3 .  GLUCOSAMINE-CHONDROITIN PO, Take 3-5 capsules by mouth daily., Disp: , Rfl:  .  ipratropium (ATROVENT) 0.03 % nasal spray, Place 2 sprays into both nostrils every 12 (twelve) hours., Disp: 30 mL, Rfl: 2 .  Multiple Vitamins-Minerals (HAIR/SKIN/NAILS) CAPS, Take 2 capsules by mouth daily., Disp: , Rfl:  .  methotrexate (RHEUMATREX) 2.5 MG  tablet, TAKE 6 TABS EVERY FRIDAY (Patient not taking: Reported on 06/28/2017), Disp: 90 tablet, Rfl: 0    Review of Systems     Objective:   Physical Exam  Constitutional: She is oriented to person, place, and time. She appears well-developed and well-nourished. No distress.  HENT:  Head: Normocephalic and atraumatic.  Right Ear: External ear normal.  Left Ear: External ear normal.  Mouth/Throat: Oropharynx is clear and moist. No oropharyngeal exudate.  Eyes: Pupils are equal, round, and reactive to light. Conjunctivae and EOM are normal. Right eye exhibits no discharge. Left eye exhibits no discharge. No scleral icterus.  Neck: Normal range of motion. Neck supple. No JVD present. No tracheal deviation present. No thyromegaly present.  Cardiovascular: Normal rate, regular rhythm, normal heart sounds and intact distal pulses. Exam reveals no gallop and no friction rub.  No murmur heard. Pulmonary/Chest: Effort normal and breath sounds normal. No respiratory distress. She has no wheezes. She has no rales. She exhibits no tenderness.  Abdominal: Soft. Bowel sounds are normal. She exhibits no distension and no mass. There is no tenderness. There is no rebound and no guarding.  Musculoskeletal: Normal range of motion. She exhibits no edema or tenderness.  Lymphadenopathy:    She has no cervical adenopathy.  Neurological: She is alert and oriented to person, place, and time. She has normal reflexes. No cranial nerve deficit. She exhibits normal muscle tone. Coordination normal.  Skin: Skin is warm and dry. No rash noted. She is not diaphoretic. No erythema. No pallor.  Psychiatric: She has a normal mood and affect. Her behavior is normal. Judgment and thought content normal.  Vitals reviewed.  Vitals:   06/28/17 0910  BP: 124/72  Pulse: 75  SpO2: 99%  Weight: 197 lb 3.2 oz (89.4 kg)  Height:  (1.549 m)    Estimated body mass index is 37.26 kg/m as calculated from the following:    Height as of this encounter:  (1.549 m).   Weight as of this encounter: 197 lb 3.2 oz (89.4 kg).     Assessment:       ICD-10-CM   1. Sarcoidosis D86.9 CBC w/Diff    Hepatic  function panel    Basic Metabolic Panel (BMET)    Calcium  2. Hypercalcemia E83.52   3. Erythema nodosum L52   4. Encounter for monitoring immunomodulating therapy Z51.81    Z79.899        Plan:      Clinically sarcoid under remission Sinus congestion due to pollen and per PCP  PLAN  no need for cxr Do cbc, bmet, lft and calcium 06/28/2017 - will call with results Continue methotrexate  weekly (you have completed 1 year) - wil continue another 6 months and reassess  Followup Return in 6 months or sooner if needed    Dr. Kalman Shan, M.D., [facility]C.P Pulmonary and Critical Care Medicine Staff Physician, [facility] Director - Interstitial Lung Disease  Program  Pulmonary Fibrosis [facility] at [facility] [place], [place], [address]  Pager: [phone], If no answer or between  15:00h - 7:00h: call [phone] Telephone: [phone]

## 2017-07-04 NOTE — Progress Notes (Signed)
This is consistent with Osteoarthritic changes.  No further evaluation needed.

## 2017-07-06 ENCOUNTER — Encounter: Payer: Self-pay | Admitting: Sports Medicine

## 2017-07-15 ENCOUNTER — Other Ambulatory Visit: Payer: Self-pay | Admitting: Internal Medicine

## 2017-08-07 ENCOUNTER — Ambulatory Visit (INDEPENDENT_AMBULATORY_CARE_PROVIDER_SITE_OTHER): Payer: BC Managed Care – PPO

## 2017-08-07 ENCOUNTER — Encounter: Payer: Self-pay | Admitting: Sports Medicine

## 2017-08-07 ENCOUNTER — Ambulatory Visit (INDEPENDENT_AMBULATORY_CARE_PROVIDER_SITE_OTHER): Payer: BC Managed Care – PPO | Admitting: Sports Medicine

## 2017-08-07 VITALS — BP 148/100 | HR 77 | Ht 61.0 in | Wt 199.4 lb

## 2017-08-07 DIAGNOSIS — M25461 Effusion, right knee: Secondary | ICD-10-CM

## 2017-08-07 DIAGNOSIS — M25562 Pain in left knee: Secondary | ICD-10-CM

## 2017-08-07 DIAGNOSIS — G8929 Other chronic pain: Secondary | ICD-10-CM | POA: Diagnosis not present

## 2017-08-07 DIAGNOSIS — D869 Sarcoidosis, unspecified: Secondary | ICD-10-CM | POA: Diagnosis not present

## 2017-08-07 NOTE — Progress Notes (Signed)
Christina Poole. Christina Poole [facility] at [facility] [phone]  Christina Poole - 52 y.o. female MRN [id number]  Date of birth: Jun 04, 1965  Visit Date: 08/07/2017  PCP: Jarold Motto, PA   Referred by: Jarold Motto, PA  Scribe(s) for today's visit: Christoper Fabian, LAT, ATC  SUBJECTIVE:  Christina Poole is here for Follow-up ( B knee pain) .    Her bilateral knee pain knee pain symptoms INITIALLY: Began over a year ago and MOI is unknown.  Described as mild throbbing, non-radiating. Pain is mostly on the lateral aspect of both knees.  Worsened with stairs Improved with rest. Additional associated symptoms include: She reports swelling and stiffness in both knees. L knee pain began first and the R knee pain developed. She feels that the swelling is starting to move into the lateral aspect of the thigh.     At this time symptoms show no change compared to onset  She has been taking glucosamine-chondrotin with some relief. She has tried Tylenol and Aleve with minimal relief. She does get some relief from icing and doing accupuncture.   08/07/17: Compared to the last office visit on 06/26/17, her previously described B knee pain symptoms are worsening w/ swelling having increased over the superior-lateral aspect of the L knee. Current symptoms are mild & are nonradiating She has been taking Tylenol and Aleve prn for pain and glucosamine chondroitin.  She had a L knee aspiration and injection at her last visit.  She has been doing her HEP 2x/week.   REVIEW OF SYSTEMS: Denies night time disturbances. Denies fevers, chills, or night sweats. Denies unexplained weight loss. Denies personal history of cancer. Denies changes in bowel or bladder habits. Denies recent unreported falls. Denies new or worsening dyspnea or wheezing. Denies headaches or dizziness.  Denies numbness, tingling or weakness  In the extremities.  Denies dizziness or presyncopal  episodes Reports lower extremity edema - in the L knee   HISTORY & PERTINENT PRIOR DATA:  Prior History reviewed and updated per electronic medical record.  Significant/pertinent history, findings, studies include:  reports that she has never smoked. She has never used smokeless tobacco. Recent Labs    08/12/17 1542  LABURIC 5.7   No specialty comments available. No problems updated.  OBJECTIVE:  VS:  HT:5\' 1"  (154.9 cm)   WT:199 lb 6.4 oz (90.4 kg)  BMI:37.7    BP:(Abnormal) 148/100  HR:77bpm  TEMP: ( )  RESP:97 %   PHYSICAL EXAM: Constitutional: WDWN, Non-toxic appearing. Psychiatric: Alert & appropriately interactive.  Not depressed or anxious appearing. Respiratory: No increased work of breathing.  Trachea Midline Eyes: Pupils are equal.  EOM intact without nystagmus.  No scleral icterus  Vascular Exam: warm to touch no edema  lower extremity neuro exam: unremarkable normal strength normal sensation  MSK Exam: Bilateral knee effusions.  She has some generalized synovitis but this is mild.  No significant overlying skin changes.  Ligamentously stable.  Pain with McMurray's and pain with palpation of medial and lateral joint lines.  Small amount of swelling of bilateral hands but this is minimal.  No significant dactylitis.   ASSESSMENT & PLAN:   1. Chronic pain of left knee   2. Effusion of right knee   3. Sarcoidosis     PLAN: Patient has polyarthralgia complaints.  She does have underlying sarcoid however would like to further evaluate for potential underlying rheumatologic condition.  We will plan to follow-up with her  after this is obtained as well as after obtaining an MRI on her knee given the recurrent effusion following injection.  Follow-up: Return for MRI results review.      Please see additional documentation for Objective, Assessment and Plan sections. Pertinent additional documentation may be included in corresponding procedure notes, imaging  studies, problem based documentation and patient instructions. Please see these sections of the encounter for additional information regarding this visit.  CMA/ATC served as Neurosurgeonscribe during this visit. History, Physical, and Plan performed by medical provider. Documentation and orders reviewed and attested to.      Andrena MewsMichael D Rigby, DO    [facility] Physician

## 2017-08-07 NOTE — Patient Instructions (Signed)

## 2017-08-12 ENCOUNTER — Other Ambulatory Visit (INDEPENDENT_AMBULATORY_CARE_PROVIDER_SITE_OTHER): Payer: BC Managed Care – PPO

## 2017-08-12 DIAGNOSIS — D869 Sarcoidosis, unspecified: Secondary | ICD-10-CM | POA: Diagnosis not present

## 2017-08-12 DIAGNOSIS — G8929 Other chronic pain: Secondary | ICD-10-CM

## 2017-08-12 DIAGNOSIS — M25562 Pain in left knee: Secondary | ICD-10-CM

## 2017-08-13 ENCOUNTER — Ambulatory Visit
Admission: RE | Admit: 2017-08-13 | Discharge: 2017-08-13 | Disposition: A | Discharge status: Home / Self Care | Payer: BC Managed Care – PPO | Source: Ambulatory Visit | Attending: Sports Medicine | Admitting: Sports Medicine

## 2017-08-13 DIAGNOSIS — M25562 Pain in left knee: Principal | ICD-10-CM

## 2017-08-13 DIAGNOSIS — G8929 Other chronic pain: Secondary | ICD-10-CM

## 2017-08-13 LAB — CBC WITH DIFFERENTIAL/PLATELET
BASOS ABS: 0.1 10*3/uL (ref 0.0–0.1)
Basophils Relative: 0.9 % (ref 0.0–3.0)
Eosinophils Absolute: 1.3 10*3/uL — ABNORMAL HIGH (ref 0.0–0.7)
Eosinophils Relative: 12.3 % — ABNORMAL HIGH (ref 0.0–5.0)
HEMATOCRIT: 37.1 % (ref 36.0–46.0)
Hemoglobin: 12.4 g/dL (ref 12.0–15.0)
LYMPHS PCT: 21 % (ref 12.0–46.0)
Lymphs Abs: 2.3 10*3/uL (ref 0.7–4.0)
MCHC: 33.4 g/dL (ref 30.0–36.0)
MCV: 92.4 fl (ref 78.0–100.0)
MONOS PCT: 5 % (ref 3.0–12.0)
Monocytes Absolute: 0.5 10*3/uL (ref 0.1–1.0)
Neutro Abs: 6.5 10*3/uL (ref 1.4–7.7)
Neutrophils Relative %: 60.8 % (ref 43.0–77.0)
Platelets: 359 10*3/uL (ref 150.0–400.0)
RBC: 4.01 Mil/uL (ref 3.87–5.11)
RDW: 13.6 % (ref 11.5–15.5)
WBC: 10.7 10*3/uL — ABNORMAL HIGH (ref 4.0–10.5)

## 2017-08-13 LAB — COMPREHENSIVE METABOLIC PANEL
ALBUMIN: 4 g/dL (ref 3.5–5.2)
ALK PHOS: 72 U/L (ref 39–117)
ALT: 14 U/L (ref 0–35)
AST: 16 U/L (ref 0–37)
BUN: 12 mg/dL (ref 6–23)
CO2: 28 mEq/L (ref 19–32)
Calcium: 9.8 mg/dL (ref 8.4–10.5)
Chloride: 105 mEq/L (ref 96–112)
Creatinine, Ser: 0.89 mg/dL (ref 0.40–1.20)
GFR: 86.19 mL/min (ref >60.00)
Glucose, Bld: 100 mg/dL — ABNORMAL HIGH (ref 70–99)
POTASSIUM: 4.3 meq/L (ref 3.5–5.1)
Sodium: 141 mEq/L (ref 135–145)
TOTAL PROTEIN: 7.5 g/dL (ref 6.0–8.3)
Total Bilirubin: 0.4 mg/dL (ref 0.2–1.2)

## 2017-08-13 LAB — URIC ACID: Uric Acid, Serum: 5.7 mg/dL (ref 2.4–7.0)

## 2017-08-13 LAB — VITAMIN D 25 HYDROXY (VIT D DEFICIENCY, FRACTURES): VITD: 26.25 ng/mL — AB (ref 30.00–100.00)

## 2017-08-13 LAB — SEDIMENTATION RATE: SED RATE: 20 mm/h (ref 0–30)

## 2017-08-13 LAB — C-REACTIVE PROTEIN: CRP: 1.5 mg/dL (ref 0.5–20.0)

## 2017-08-14 ENCOUNTER — Telehealth: Payer: Self-pay | Admitting: Physician Assistant

## 2017-08-14 LAB — ANA: ANA: NEGATIVE

## 2017-08-14 LAB — RHEUMATOID FACTOR

## 2017-08-14 LAB — CYCLIC CITRUL PEPTIDE ANTIBODY, IGG

## 2017-08-14 NOTE — Telephone Encounter (Signed)
Copied from CRM [phone]. Topic: Quick Communication - Other Results >> Aug 14, 2017  9:22 AM Stephannie LiSimmons, Shawneen Deetz L, NT wrote: Patient would like a call concerning her radiology results please call her at  [phone], she will be out of town next week

## 2017-08-14 NOTE — Telephone Encounter (Signed)
See note

## 2017-08-26 ENCOUNTER — Encounter: Payer: Self-pay | Admitting: Adult Health

## 2017-08-26 MED ORDER — METHOTREXATE 2.5 MG PO TABS: ORAL_TABLET | ORAL | 0 refills | Status: DC

## 2017-08-26 NOTE — Telephone Encounter (Signed)
Medication has been sent in. Patient is aware. Nothing else needed at time of message.

## 2017-08-27 LAB — PULMONARY FUNCTION TEST
DL/VA % pred: 113 %
DL/VA: 5.01 ml/min/mmHg/L
DLCO COR % PRED: 93 %
DLCO UNC: 18.61 ml/min/mmHg
DLCO cor: 18.84 ml/min/mmHg
DLCO unc % pred: 92 %
FEF 25-75 PRE: 1.96 L/s
FEF2575-%PRED-PRE: 87 %
FEV1-%Pred-Pre: 93 %
FEV1-PRE: 1.92 L
FEV1FVC-%Pred-Pre: 98 %
FEV6-%PRED-PRE: 96 %
FEV6-PRE: 2.41 L
FEV6FVC-%Pred-Pre: 102 %
FVC-%PRED-PRE: 93 %
FVC-PRE: 2.41 L
PRE FEV1/FVC RATIO: 80 %
Pre FEV6/FVC Ratio: 100 %

## 2017-08-30 ENCOUNTER — Encounter: Payer: Self-pay | Admitting: Sports Medicine

## 2017-09-04 ENCOUNTER — Telehealth: Payer: Self-pay | Admitting: Sports Medicine

## 2017-09-04 NOTE — Telephone Encounter (Signed)
See note.   Copied from CRM [phone]. Topic: Quick Communication - Office Called Patient >> Sep 04, 2017  9:37 AM Dierdre Searlesoleman, Brandy S, CMA wrote: Reason for CRM: Called pt and left VM to call the office.   >> Sep 04, 2017  9:37 AM Dierdre Searlesoleman, Brandy S, CMA wrote: Following up on MRI >> Sep 04, 2017  9:45 AM Windy KalataMichael, Taylor L, NT wrote: [place]. Please contact

## 2017-09-04 NOTE — Telephone Encounter (Signed)
Called pt and left VM to call the office (going to make sure she saw results on MyChart and see if she has any questions/concerns).   Dr. Berline Choughigby, would you prefer pt schedule OV to discuss results?

## 2017-09-04 NOTE — Telephone Encounter (Signed)
Pt needed a follow up to review results.  May need a knee replacement

## 2017-09-04 NOTE — Telephone Encounter (Signed)
Spoke with patient and scheduled OV for 09/04/17 at 10:40 AM. Dr. Berline Choughigby, pt expressed concern about high OV copay, does she need to come in to review results?

## 2017-09-05 ENCOUNTER — Encounter: Payer: Self-pay | Admitting: Sports Medicine

## 2017-09-05 ENCOUNTER — Ambulatory Visit (INDEPENDENT_AMBULATORY_CARE_PROVIDER_SITE_OTHER): Payer: BC Managed Care – PPO | Admitting: Sports Medicine

## 2017-09-05 VITALS — BP 134/90 | HR 74 | Ht 61.0 in | Wt 199.8 lb

## 2017-09-05 DIAGNOSIS — M25461 Effusion, right knee: Secondary | ICD-10-CM

## 2017-09-05 DIAGNOSIS — M1712 Unilateral primary osteoarthritis, left knee: Secondary | ICD-10-CM | POA: Diagnosis not present

## 2017-09-05 DIAGNOSIS — G8929 Other chronic pain: Secondary | ICD-10-CM

## 2017-09-05 DIAGNOSIS — M659 Synovitis and tenosynovitis, unspecified: Secondary | ICD-10-CM | POA: Diagnosis not present

## 2017-09-05 DIAGNOSIS — M25562 Pain in left knee: Secondary | ICD-10-CM | POA: Diagnosis not present

## 2017-09-05 NOTE — Progress Notes (Signed)
Veverly FellsMichael D. Delorise Shinerigby, DO  [facility] [place] [phone]  Christina Poole Smail - 52 y.o. female MRN [id number]  Date of birth: 12/19/1965  Visit Date: 09/05/2017  PCP: Jarold MottoWorley, Samantha, PA   Referred by: Jarold MottoWorley, Samantha, PA  Scribe(s) for today's visit: Christoper FabianMolly Weber, LAT, ATC  SUBJECTIVE:  Christina Poole is here for Follow-up (L knee MRI review) .    Her bilateral knee pain knee pain symptoms INITIALLY: Began over a year ago and MOI is unknown.  Described as mild throbbing, non-radiating. Pain is mostly on the lateral aspect of both knees.  Worsened with stairs Improved with rest. Additional associated symptoms include: She reports swelling and stiffness in both knees. L knee pain began first and the R knee pain developed. She feels that the swelling is starting to move into the lateral aspect of the thigh.     At this time symptoms show no change compared to onset  She has been taking glucosamine-chondrotin with some relief. She has tried Tylenol and Aleve with minimal relief. She does get some relief from icing and doing accupuncture.   08/07/17: Compared to the last office visit on 06/26/17, her previously described B knee pain symptoms are worsening w/ swelling having increased over the superior-lateral aspect of the L knee. Current symptoms are mild & are nonradiating She has been taking Tylenol and Aleve prn for pain and glucosamine chondroitin.  She had a L knee aspiration and injection at her last visit.  She has been doing her HEP 2x/week.  09/05/2017: Compared to the last office visit on 08/07/17, her previously described L knee pain symptoms are slightly improved.  She states that she has decent L knee ROM today.  She also notes that she has been doing water aerobics about 2-3 days/week. Current symptoms are mild & are nonradiating She has been taking glucosamine-chondroitin but has not had to use Tylenol or Aleve.  She has been doing her  HEP approximately 2x/week.  She had a L knee aspiration and injection on 06/26/17.  L knee XR - 08/07/17 L knee MRI - 08/14/17   REVIEW OF SYSTEMS: Denies night time disturbances. Denies fevers, chills, or night sweats. Denies unexplained weight loss. Denies personal history of cancer. Denies changes in bowel or bladder habits. Denies recent unreported falls. Denies new or worsening dyspnea or wheezing. Denies headaches or dizziness.  Denies numbness, tingling or weakness  In the extremities.  Denies dizziness or presyncopal episodes Reports lower extremity edema in L knee   HISTORY & PERTINENT PRIOR DATA:  Prior History reviewed and updated per electronic medical record.  Significant/pertinent history, findings, studies include:  reports that she has never smoked. She has never used smokeless tobacco. Recent Labs    08/12/17 1542  LABURIC 5.7   09/11/2017 - Zilretta and CPT code [id number] covered at 100% with $80 copay. -BSC No problems updated.  OBJECTIVE:  VS:  HT:5\' 1"  (154.9 cm)   WT:199 lb 12.8 oz (90.6 kg)  BMI:37.77    BP:134/90  HR:74bpm  TEMP: ( )  RESP:97 %   PHYSICAL EXAM: Constitutional: WDWN, Non-toxic appearing. Psychiatric: Alert & appropriately interactive.  Not depressed or anxious appearing. Respiratory: No increased work of breathing.  Trachea Midline Eyes: Pupils are equal.  EOM intact without nystagmus.  No scleral icterus  Vascular Exam: warm to touch no edema  lower extremity neuro exam: unremarkable  MSK Exam: Knee with generalized osteoarthritic bossing.  She has a moderate  effusion.  Pain with McMurray's.   ASSESSMENT & PLAN:   1. Effusion of right knee   2. Chronic pain of left knee   3. Primary osteoarthritis of left knee   4. Synovitis of left knee     PLAN:   MRI reviewed today patient ultimately she has tricompartmental degenerative changes and is a candidate and is interested in long-acting Zilretta injections.  We will get  prior authorization for this and have her follow-up at her convenience to have this performed.  Ultimately she is likely not a surgical candidate from the standpoint of an arthroscopy and ultimately would require a total knee arthroplasty.  >50% of this 25 minute visit spent in direct patient counseling and/or coordination of care.  Discussion was focused on education regarding the in discussing the pathoetiology and anticipated clinical course of the above condition.    Follow-up: Return for at your convenience once insurance benefits determined for Zilretta.      Please see additional documentation for Objective, Assessment and Plan sections. Pertinent additional documentation may be included in corresponding procedure notes, imaging studies, problem based documentation and patient instructions. Please see these sections of the encounter for additional information regarding this visit.  CMA/ATC served as Neurosurgeon during this visit. History, Physical, and Plan performed by medical provider. Documentation and orders reviewed and attested to.      [name], DO    [facility] Physician

## 2017-09-16 ENCOUNTER — Encounter: Payer: Self-pay | Admitting: Sports Medicine

## 2017-09-16 ENCOUNTER — Ambulatory Visit (INDEPENDENT_AMBULATORY_CARE_PROVIDER_SITE_OTHER): Payer: BC Managed Care – PPO | Admitting: Sports Medicine

## 2017-09-16 ENCOUNTER — Ambulatory Visit: Payer: Self-pay

## 2017-09-16 VITALS — BP 130/88 | HR 95 | Ht 61.0 in | Wt 199.6 lb

## 2017-09-16 DIAGNOSIS — D869 Sarcoidosis, unspecified: Secondary | ICD-10-CM

## 2017-09-16 DIAGNOSIS — M1712 Unilateral primary osteoarthritis, left knee: Secondary | ICD-10-CM

## 2017-09-16 DIAGNOSIS — G8929 Other chronic pain: Secondary | ICD-10-CM

## 2017-09-16 DIAGNOSIS — M25562 Pain in left knee: Secondary | ICD-10-CM

## 2017-09-16 DIAGNOSIS — M25461 Effusion, right knee: Secondary | ICD-10-CM

## 2017-09-16 DIAGNOSIS — M659 Synovitis and tenosynovitis, unspecified: Secondary | ICD-10-CM

## 2017-09-16 NOTE — Patient Instructions (Signed)

## 2017-09-16 NOTE — Progress Notes (Signed)
Veverly FellsMichael D. Delorise Shinerigby, DO  [facility] [phone]  Christina Poole - 52 y.o. female MRN [id number]  Date of birth: 02/10/1966  Visit Date: 09/16/2017  PCP: Jarold MottoWorley, Samantha, PA   Referred by: Jarold MottoWorley, Samantha, PA  Scribe(s) for today's visit: Christoper FabianMolly Weber, LAT, ATC  SUBJECTIVE:  Christina Poole is here for Follow-up (L knee pain) .    Her bilateral knee pain knee pain symptoms INITIALLY: Began over a year ago and MOI is unknown.  Described as mild throbbing, non-radiating. Pain is mostly on the lateral aspect of both knees.  Worsened with stairs Improved with rest. Additional associated symptoms include: She reports swelling and stiffness in both knees. L knee pain began first and the R knee pain developed. She feels that the swelling is starting to move into the lateral aspect of the thigh.     At this time symptoms show no change compared to onset  She has been taking glucosamine-chondrotin with some relief. She has tried Tylenol and Aleve with minimal relief. She does get some relief from icing and doing accupuncture.   08/07/17: Compared to the last office visit on 06/26/17, her previously described B knee pain symptoms are worsening w/ swelling having increased over the superior-lateral aspect of the L knee. Current symptoms are mild & are nonradiating She has been taking Tylenol and Aleve prn for pain and glucosamine chondroitin.  She had a L knee aspiration and injection at her last visit.  She has been doing her HEP 2x/week.  09/05/2017: Compared to the last office visit on 08/07/17, her previously described L knee pain symptoms are slightly improved.  She states that she has decent L knee ROM today.  She also notes that she has been doing water aerobics about 2-3 days/week. Current symptoms are mild & are nonradiating She has been taking glucosamine-chondroitin but has not had to use Tylenol or Aleve.  She has been doing her HEP  approximately 2x/week.  She had a L knee aspiration and injection on 06/26/17.  L knee XR - 08/07/17 L knee MRI - 08/14/17  09/16/2017: Compared to the last office visit on 09/05/17, her previously described L knee pain symptoms show no change  Current symptoms are mild & are nonradiating She has been taking glucosamine chondroitin.  She has been doing her HEP approximately 2x/week.  She had a prior L knee injection on 06/26/17 and is here today for a Zilretta injection.   REVIEW OF SYSTEMS: Denies night time disturbances. Denies fevers, chills, or night sweats. Denies unexplained weight loss. Denies personal history of cancer. Denies changes in bowel or bladder habits. Denies recent unreported falls. Denies new or worsening dyspnea or wheezing. Denies headaches or dizziness.  Denies numbness, tingling or weakness  In the extremities.  Denies dizziness or presyncopal episodes Reports lower extremity edema    HISTORY & PERTINENT PRIOR DATA:  Prior History reviewed and updated per electronic medical record.  Significant/pertinent history, findings, studies include:  reports that she has never smoked. She has never used smokeless tobacco. Recent Labs    08/12/17 1542  LABURIC 5.7   09/11/2017 - Zilretta and CPT code [id number] covered at 100% with $80 copay. -BSC No problems updated.  OBJECTIVE:  VS:  HT:5\' 1"  (154.9 cm)   WT:199 lb 9.6 oz (90.5 kg)  BMI:37.73    BP:130/88  HR:95bpm  TEMP: ( )  RESP:96 %   PHYSICAL EXAM: Constitutional: WDWN, Non-toxic appearing. Psychiatric: Alert &  appropriately interactive.  Not depressed or anxious appearing. Respiratory: No increased work of breathing.  Trachea Midline Eyes: Pupils are equal.  EOM intact without nystagmus.  No scleral icterus  Vascular Exam: warm to touch no edema  lower extremity neuro exam: unremarkable  MSK Exam: Large effusion, medial and lateral joint line pain.  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 Chronic pain of left knee   2. Effusion of right knee   3. Primary osteoarthritis of left knee   4. Synovitis of left knee   5. Sarcoidosis     PLAN: Zilretta injection performed today procedure only visit  Follow-up: Return if symptoms worsen or fail to improve.  She is moving out of state in 1 month and will try to establish care there.  Information for Zilretta provided again today so she can provide this information to her new provider.       Please see additional documentation for Objective, Assessment and Plan sections. Pertinent additional documentation may be included in corresponding procedure notes, imaging studies, problem based documentation and patient instructions. Please see these sections of the encounter for additional information regarding this visit.  CMA/ATC served as Neurosurgeon during this visit. History, Physical, and Plan performed by medical provider. Documentation and orders reviewed and attested to.      [name], DO    [facility] Physician

## 2017-09-16 NOTE — Procedures (Signed)
PROCEDURE NOTE:  Ultrasound Guided: Injection: Left knee Images were obtained and interpreted by myself, Gaspar BiddingMichael Rigby, DO  Images have been saved and stored to PACS system. Images obtained on: GE S7 Ultrasound machine    ULTRASOUND FINDINGS:  Large effusions with significant synovitis  DESCRIPTION OF PROCEDURE:  The patient's clinical condition is marked by substantial pain and/or significant functional disability. Other conservative therapy has not provided relief, is contraindicated, or not appropriate. There is a reasonable likelihood that injection will significantly improve the patient's pain and/or functional impairment.   After discussing the risks, benefits and expected outcomes of the injection and all questions were reviewed and answered, the patient wished to undergo the above named procedure.  Verbal consent was obtained.  The ultrasound was used to identify the target structure and adjacent neurovascular structures. The skin was then prepped in sterile fashion and the target structure was injected under direct visualization using sterile technique as below:  Single injection performed as below: PREP: Alcohol, Ethel Chloride and 5 cc 1% lidocaine on 25g 1.5 in. needle APPROACH:superiolateral, stopcock technique, 18g 1.5 in. INJECTATE: 5 cc Zilretta (32mg  Triamcinolone Sustained Release) ASPIRATE: 60cc serous fluid, slightly blood tinged DRESSING: Band-Aid  Post procedural instructions including recommending icing and warning signs for infection were reviewed.    This procedure was well tolerated and there were no complications.   IMPRESSION: Succesful Ultrasound Guided: Aspiration and Injection

## 2017-09-27 ENCOUNTER — Telehealth: Payer: Self-pay | Admitting: Physician Assistant

## 2017-09-27 MED ORDER — AMLODIPINE BESY-BENAZEPRIL HCL 5-20 MG PO CAPS: 1.0000 | ORAL_CAPSULE | Freq: Every day | ORAL | 0 refills | Status: DC

## 2017-09-27 NOTE — Telephone Encounter (Signed)
Rx sent to pharmacy   

## 2017-09-27 NOTE — Telephone Encounter (Signed)
MEDICATION:  amLODipine-benazepril (LOTREL) 5-20 MG capsule  PHARMACY:    [id number] [place] - [address] [phone] (Phone) [phone] (Fax)       IS THIS A 90 DAY SUPPLY : Y  IS PATIENT OUT OF MEDICATION: Y  IF NOT; HOW MUCH IS LEFT:   LAST APPOINTMENT DATE: @7 /22/2019  NEXT APPOINTMENT DATE:@Visit  date not found  OTHER COMMENTS:    **Let patient know to contact pharmacy at the end of the day to make sure medication is ready. **  ** Please notify patient to allow 48-72 hours to process**  **Encourage patient to contact the pharmacy for refills or they can request refills through [facility]**

## 2017-12-21 ENCOUNTER — Other Ambulatory Visit: Payer: Self-pay | Admitting: Physician Assistant

## 2018-08-23 IMAGING — DX DG KNEE AP/LAT W/ SUNRISE*L*
3 series · 3 of 3 positions shown · non-contrast
Comparison: None

CLINICAL DATA: Chronic LEFT knee pain especially laterally, no
injury, worsened pain when going up stairs

EXAM:
LEFT KNEE 3 VIEWS

[knee standing ap]
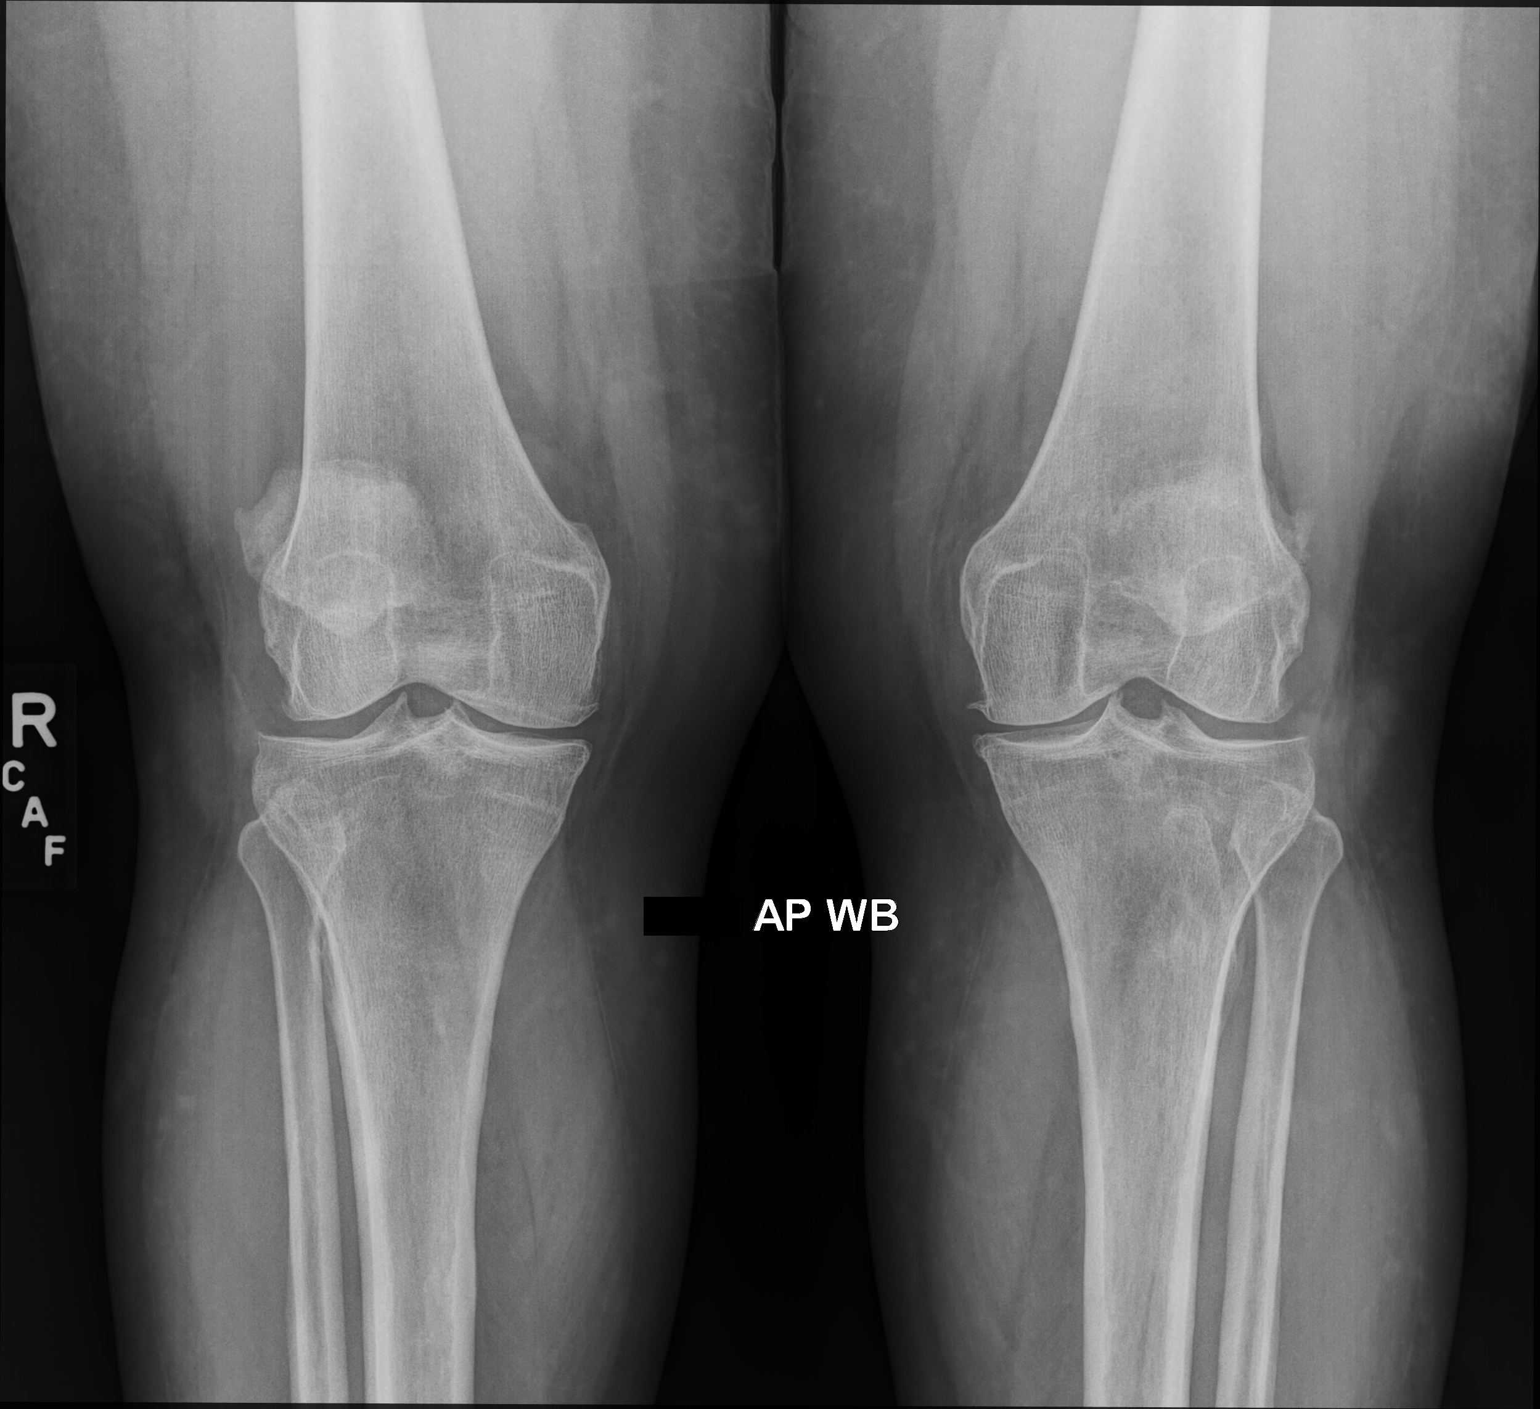

[knee standing lat]
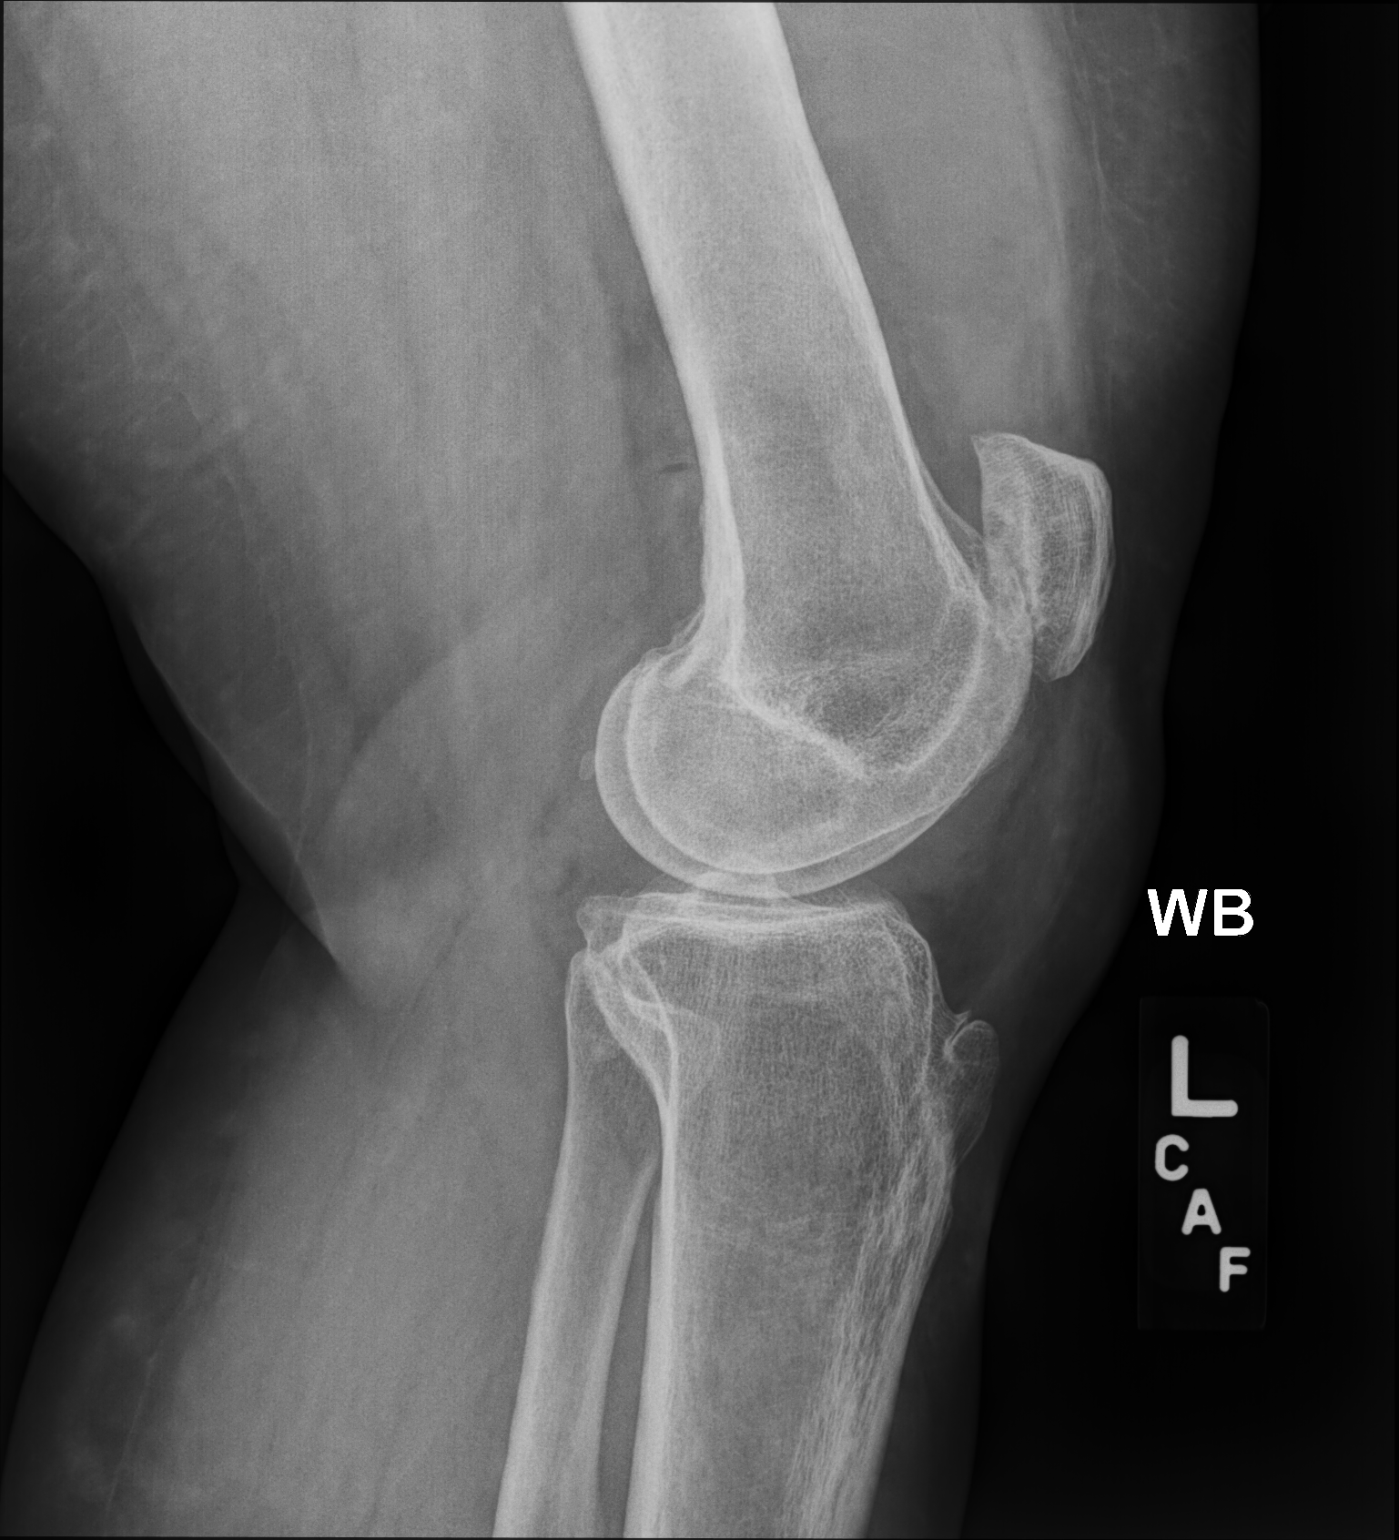

[sunrise]
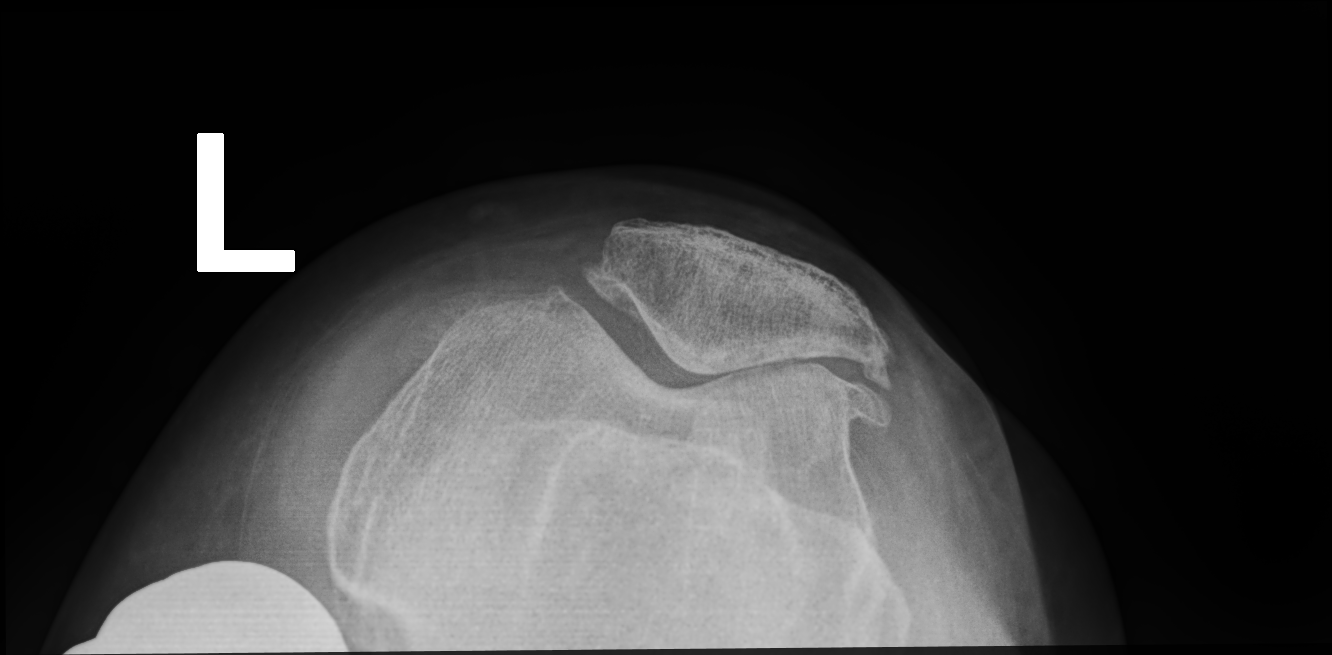

[3 of 3 positions shown; findings below may reference images not displayed]

FINDINGS: Osseous demineralization.

Scattered joint space narrowing and spur formation.

No acute fracture, dislocation, or bone destruction.

No knee joint effusion.

Comparison AP view of the RIGHT knee demonstrates narrowing of the
medial and lateral compartments.
IMPRESSION: Degenerative changes in both knees.

## 2019-03-26 DIAGNOSIS — M79642 Pain in left hand: Secondary | ICD-10-CM | POA: Insufficient documentation

## 2019-09-01 DIAGNOSIS — M1220 Villonodular synovitis (pigmented), unspecified site: Secondary | ICD-10-CM | POA: Insufficient documentation

## 2019-09-02 ENCOUNTER — Ambulatory Visit (INDEPENDENT_AMBULATORY_CARE_PROVIDER_SITE_OTHER): Payer: BC Managed Care – PPO | Admitting: Physician Assistant

## 2019-09-02 ENCOUNTER — Other Ambulatory Visit: Payer: Self-pay

## 2019-09-02 ENCOUNTER — Encounter: Payer: Self-pay | Admitting: Physician Assistant

## 2019-09-02 VITALS — BP 130/80 | HR 101 | Temp 97.8°F | Ht 61.0 in | Wt 217.0 lb

## 2019-09-02 DIAGNOSIS — I10 Essential (primary) hypertension: Secondary | ICD-10-CM | POA: Diagnosis not present

## 2019-09-02 DIAGNOSIS — M542 Cervicalgia: Secondary | ICD-10-CM

## 2019-09-02 LAB — CBC WITH DIFFERENTIAL/PLATELET
Basophils Absolute: 0.1 10*3/uL (ref 0.0–0.1)
Basophils Relative: 0.6 % (ref 0.0–3.0)
Eosinophils Absolute: 0.7 10*3/uL (ref 0.0–0.7)
Eosinophils Relative: 5 % (ref 0.0–5.0)
HCT: 34.4 % — ABNORMAL LOW (ref 36.0–46.0)
Hemoglobin: 11.2 g/dL — ABNORMAL LOW (ref 12.0–15.0)
Lymphocytes Relative: 20.1 % (ref 12.0–46.0)
Lymphs Abs: 2.6 10*3/uL (ref 0.7–4.0)
MCHC: 32.5 g/dL (ref 30.0–36.0)
MCV: 90.5 fl (ref 78.0–100.0)
Monocytes Absolute: 0.8 10*3/uL (ref 0.1–1.0)
Monocytes Relative: 6.3 % (ref 3.0–12.0)
Neutro Abs: 8.9 10*3/uL — ABNORMAL HIGH (ref 1.4–7.7)
Neutrophils Relative %: 68 % (ref 43.0–77.0)
Platelets: 411 10*3/uL — ABNORMAL HIGH (ref 150.0–400.0)
RBC: 3.8 Mil/uL — ABNORMAL LOW (ref 3.87–5.11)
RDW: 14 % (ref 11.5–15.5)
WBC: 13.1 10*3/uL — ABNORMAL HIGH (ref 4.0–10.5)

## 2019-09-02 LAB — COMPREHENSIVE METABOLIC PANEL
ALT: 11 U/L (ref 0–35)
AST: 14 U/L (ref 0–37)
Albumin: 4 g/dL (ref 3.5–5.2)
Alkaline Phosphatase: 93 U/L (ref 39–117)
BUN: 14 mg/dL (ref 6–23)
CO2: 27 mEq/L (ref 19–32)
Calcium: 9.2 mg/dL (ref 8.4–10.5)
Chloride: 103 mEq/L (ref 96–112)
Creatinine, Ser: 0.8 mg/dL (ref 0.40–1.20)
GFR: 90.97 mL/min (ref >60.00)
Glucose, Bld: 98 mg/dL (ref 70–99)
Potassium: 4.2 mEq/L (ref 3.5–5.1)
Sodium: 137 mEq/L (ref 135–145)
Total Bilirubin: 0.6 mg/dL (ref 0.2–1.2)
Total Protein: 7.6 g/dL (ref 6.0–8.3)

## 2019-09-02 MED ORDER — AMLODIPINE BESY-BENAZEPRIL HCL 5-20 MG PO CAPS: ORAL_CAPSULE | ORAL | 3 refills | Status: DC

## 2019-09-02 NOTE — Progress Notes (Signed)
Christina Poole is a 54 y.o. female is here to discuss: Hypertension  I acted as a Neurosurgeon for [employer], PA-C Corky Mull, LPN   History of Present Illness:   Chief Complaint  Patient presents with  . Hypertension    HPI   Hypertension Pt following up today on blood pressure. She is currently on Amlodopine-Benazepril 5-20 mg daily. Pt has not been checking blood pressure at home. Pt denies headaches, dizziness, blurred vision, chest pain, SOB or lower leg edema. Denies excessive caffeine intake, stimulant usage, excessive alcohol intake or increase in salt consumption.  BP Readings from Last 3 Encounters:  09/02/19 130/80  09/16/17 130/88  09/05/17 134/90    Cervical pain Carries a lot of tension in her neck. She states that she has issues with neck pain that are usually resolved with massage. She would like to be able to use her Flex Spending account money for this.   Health Maintenance Due  Topic Date Due  . Hepatitis C Screening  Never done  . COVID-19 Vaccine (1) Never done  . HIV Screening  Never done  . PAP SMEAR-Modifier  Never done  . MAMMOGRAM  Never done  . COLONOSCOPY  Never done    Past Medical History:  Diagnosis Date  . Colitis   . Sarcoidosis      Social History   Tobacco Use  . Smoking status: Never Smoker  . Smokeless tobacco: Never Used  Vaping Use  . Vaping Use: Never used  Substance Use Topics  . Alcohol use: No  . Drug use: No    Past Surgical History:  Procedure Laterality Date  . APPENDECTOMY    . CESAREAN SECTION    . MYOMECTOMY      Family History  Problem Relation Age of Onset  . Colon cancer Father        in his 53s  . Diabetes Mother   . Hypercholesterolemia Mother     PMHx, SurgHx, SocialHx, FamHx, Medications, and Allergies were reviewed in the Visit Navigator and updated as appropriate.   Patient Active Problem List   Diagnosis Date Noted  . 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 Pain of  left hand 03/26/2019  . Acute bronchitis 11/08/2016  . Pain in left knee 09/26/2016  . Colitis 09/17/2016  . Obstructive sleep apnea 09/17/2016  . Sarcoidosis 09/07/2016  . Hypercalcemia 09/07/2016  . Essential hypertension 08/13/2015  . LVH (left ventricular hypertrophy) 08/13/2015  . Hyperlipidemia 04/02/2015    Social History   Tobacco Use  . Smoking status: Never Smoker  . Smokeless tobacco: Never Used  Vaping Use  . Vaping Use: Never used  Substance Use Topics  . Alcohol use: No  . Drug use: No    Current Medications and Allergies:    Current Outpatient Medications:  .  amLODipine-benazepril (LOTREL) 5-20 MG capsule, TAKE 1 CAPSULE BY MOUTH EVERY DAY, Disp: 90 capsule, Rfl: 3 .  cholecalciferol (VITAMIN D) 1000 units tablet, Take 2,000 Units by mouth daily., Disp: , Rfl:  .  folic acid (FOLVITE) 1 MG tablet, Take 1 tablet (1 mg total) by mouth daily., Disp: 30 tablet, Rfl: 3 .  GLUCOSAMINE-CHONDROITIN PO, Take 3-5 capsules by mouth daily., Disp: , Rfl:    Allergies  Allergen Reactions  . Codeine     Severe unrelenting nausea    Review of Systems   ROS  Negative unless otherwise specified per HPI.  Vitals:   Vitals:   09/02/19 1413  BP:  130/80  Pulse: (!) 101  Temp: 97.8 F (36.6 C)  TempSrc: Temporal  SpO2: 98%  Weight: 217 lb (98.4 kg)  Height: 5\' 1"  (1.549 m)     Body mass index is 41 kg/m.   Physical Exam:    Physical Exam Vitals and nursing note reviewed.  Constitutional:      General: She is not in acute distress.    Appearance: She is well-developed. She is not ill-appearing or toxic-appearing.  Cardiovascular:     Rate and Rhythm: Normal rate and regular rhythm.     Pulses: Normal pulses.     Heart sounds: Normal heart sounds, S1 normal and S2 normal.     Comments: No LE edema Pulmonary:     Effort: Pulmonary effort is normal.     Breath sounds: Normal breath sounds.  Skin:    General: Skin is warm and dry.  Neurological:      Mental Status: She is alert.     GCS: GCS eye subscore is 4. GCS verbal subscore is 5. GCS motor subscore is 6.  Psychiatric:        Speech: Speech normal.        Behavior: Behavior normal. Behavior is cooperative.        Assessment and Plan:    Livana was seen today for hypertension.  Diagnoses and all orders for this visit:  Essential hypertension Currently well controlled. Continue medication as prescribed. Follow-up in 6 months, sooner if concerns. -     CBC with Differential/Platelet -     Comprehensive metabolic panel  Cervicalgia Form completed for patient to have therapeutic massages. Follow-up prn.  Other orders -     amLODipine-benazepril (LOTREL) 5-20 MG capsule; TAKE 1 CAPSULE BY MOUTH EVERY DAY   . Reviewed expectations re: course of current medical issues. . Discussed self-management of symptoms. . Outlined signs and symptoms indicating need for more acute intervention. . Patient verbalized understanding and all questions were answered. . See orders for this visit as documented in the electronic medical record. . Patient received an After Visit Summary.  CMA or LPN served as scribe during this visit. History, Physical, and Plan performed by medical provider. The above documentation has been reviewed and is accurate and complete.  , PA-C [place] [place] 09/02/2019  Follow-up: No follow-ups on file.

## 2019-09-02 NOTE — Patient Instructions (Signed)
It was great to see you!  Refill of bp med sent. I will be in touch via MyChart with labs.  Let's follow-up for a physical if/when you want.  Take care,  Jarold Motto PA-C

## 2019-09-04 ENCOUNTER — Other Ambulatory Visit: Payer: Self-pay | Admitting: Physician Assistant

## 2019-09-04 DIAGNOSIS — D72829 Elevated white blood cell count, unspecified: Secondary | ICD-10-CM

## 2019-10-05 ENCOUNTER — Other Ambulatory Visit: Payer: BC Managed Care – PPO

## 2019-10-05 ENCOUNTER — Other Ambulatory Visit: Payer: Self-pay

## 2019-10-05 DIAGNOSIS — D72829 Elevated white blood cell count, unspecified: Secondary | ICD-10-CM

## 2019-10-05 LAB — CBC WITH DIFFERENTIAL/PLATELET
Absolute Monocytes: 536 cells/uL (ref 200–950)
Basophils Absolute: 72 cells/uL (ref 0–200)
Basophils Relative: 0.7 %
Eosinophils Absolute: 968 cells/uL — ABNORMAL HIGH (ref 15–500)
Eosinophils Relative: 9.4 %
HCT: 35.6 % (ref 35.0–45.0)
Hemoglobin: 11.6 g/dL — ABNORMAL LOW (ref 11.7–15.5)
Lymphs Abs: 2760 cells/uL (ref 850–3900)
MCH: 29 pg (ref 27.0–33.0)
MCHC: 32.6 g/dL (ref 32.0–36.0)
MCV: 89 fL (ref 80.0–100.0)
MPV: 9.6 fL (ref 7.5–12.5)
Monocytes Relative: 5.2 %
Neutro Abs: 5964 cells/uL (ref 1500–7800)
Neutrophils Relative %: 57.9 %
Platelets: 319 10*3/uL (ref 140–400)
RBC: 4 10*6/uL (ref 3.80–5.10)
RDW: 12.8 % (ref 11.0–15.0)
Total Lymphocyte: 26.8 %
WBC: 10.3 10*3/uL (ref 3.8–10.8)

## 2019-11-20 ENCOUNTER — Encounter: Payer: BC Managed Care – PPO | Admitting: Physician Assistant

## 2020-01-18 LAB — HM MAMMOGRAPHY

## 2020-01-20 ENCOUNTER — Encounter: Payer: Self-pay | Admitting: Physician Assistant

## 2020-01-28 ENCOUNTER — Ambulatory Visit: Payer: Self-pay

## 2020-08-12 ENCOUNTER — Ambulatory Visit: Payer: Self-pay | Admitting: Family Medicine

## 2020-08-26 ENCOUNTER — Other Ambulatory Visit: Payer: Self-pay

## 2020-08-26 ENCOUNTER — Ambulatory Visit: Payer: BC Managed Care – PPO | Admitting: Physician Assistant

## 2020-08-26 VITALS — BP 149/95 | HR 80 | Temp 98.6°F | Ht 61.0 in | Wt 220.0 lb

## 2020-08-26 DIAGNOSIS — I10 Essential (primary) hypertension: Secondary | ICD-10-CM | POA: Diagnosis not present

## 2020-08-26 DIAGNOSIS — Z6841 Body Mass Index (BMI) 40.0 and over, adult: Secondary | ICD-10-CM | POA: Diagnosis not present

## 2020-08-26 DIAGNOSIS — D869 Sarcoidosis, unspecified: Secondary | ICD-10-CM | POA: Diagnosis not present

## 2020-08-26 NOTE — Progress Notes (Signed)
Established Patient Office Visit  Subjective:  Patient ID: Christina Poole, female    DOB: 1966-02-18  Age: 55 y.o. MRN: [id number]  CC:  Chief Complaint  Patient presents with   Referral    Tommi Rumps    HPI Tiare ETHNE JEON presents for referral from primary care office. Pt states she is needing a referral to rheumatology office for sarcoidosis. States she was diagnosed around 2016. She is having swelling in her joints and some lower leg scaly lesions appearing over the last month. She previously saw rheumatology in [place] and has not had issues since around time of diagnosis. She previously took steroids for several months and states that she does not want to take these again, as they tend to cause more swelling, pain in her hips, restless sleep, & weight gain.  She is also struggling with her weight and wanting referral for help managing this.   Past Medical History:  Diagnosis Date   Colitis    Sarcoidosis     Past Surgical History:  Procedure Laterality Date   APPENDECTOMY     CESAREAN SECTION     MYOMECTOMY      Family History  Problem Relation Age of Onset   Colon cancer Father        in his 34s   Diabetes Mother    Hypercholesterolemia Mother     Social History   Socioeconomic History   Marital status: Married    Spouse name: Not on file   Number of children: Not on file   Years of education: Not on file   Highest education level: Not on file  Occupational History   Not on file  Tobacco Use   Smoking status: Never   Smokeless tobacco: Never  Vaping Use   Vaping Use: Never used  Substance and Sexual Activity   Alcohol use: No   Drug use: No   Sexual activity: Yes    Birth control/protection: None  Other Topics Concern   Not on file  Social History Narrative   From [place], went to [employer]    Married, two children, twins   Social Determinants of Corporate investment banker Strain: Not on file  Food Insecurity: Not on file   Transportation Needs: Not on file  Physical Activity: Not on file  Stress: Not on file  Social Connections: Not on file  Intimate Partner Violence: Not on file    Outpatient Medications Prior to Visit  Medication Sig Dispense Refill   amLODipine-benazepril (LOTREL) 5-20 MG capsule TAKE 1 CAPSULE BY MOUTH EVERY DAY 90 capsule 3   cholecalciferol (VITAMIN D) 1000 units tablet Take 2,000 Units by mouth daily.     Multiple Vitamins-Minerals (ONE DAILY CALCIUM/IRON) TABS Take by mouth.     folic acid (FOLVITE) 1 MG tablet Take 1 tablet (1 mg total) by mouth daily. 30 tablet 3   GLUCOSAMINE-CHONDROITIN PO Take 3-5 capsules by mouth daily.     HYDROcodone-acetaminophen (NORCO/VICODIN) 5-325 MG tablet hydrocodone 5 mg-acetaminophen 325 mg tablet  TAKE 1 TABLET BY MOUTH EVERY 6 HOURS AS NEEDED FOR PAIN     inFLIXimab (REMICADE) 100 MG injection Inject into the vein.     methocarbamol (ROBAXIN) 500 MG tablet methocarbamol 500 mg tablet  TAKE 1 TABLET BY MOUTH 3 TIMES A DAY AS NEEDED FOR SPASM     No facility-administered medications prior to visit.    Allergies  Allergen Reactions   Codeine     Severe  unrelenting nausea    ROS Review of Systems REFER TO HPI FOR PERTINENT POSITIVES AND NEGATIVES    Objective:    Physical Exam Vitals and nursing note reviewed.  Constitutional:      Appearance: Normal appearance. She is obese.  HENT:     Right Ear: External ear normal.     Left Ear: External ear normal.     Nose: Nose normal.  Eyes:     Extraocular Movements: Extraocular movements intact.     Pupils: Pupils are equal, round, and reactive to light.  Cardiovascular:     Rate and Rhythm: Normal rate and regular rhythm.     Heart sounds: No murmur heard. Pulmonary:     Effort: Pulmonary effort is normal.     Breath sounds: Normal breath sounds.  Musculoskeletal:     Right lower leg: 1+ Edema present.     Left lower leg: 1+ Edema present.  Skin:    Comments: RLE several 2-3  cm oval macular hyperpigmented areas with dryness overlying, no TTP, non pruritic   Neurological:     General: No focal deficit present.     Mental Status: She is alert and oriented to person, place, and time.  Psychiatric:        Mood and Affect: Mood normal.        Behavior: Behavior normal.    BP (!) 149/95   Pulse 80   Temp 98.6 F (37 C)   Ht 5\' 1"  (1.549 m)   Wt 220 lb (99.8 kg)   SpO2 97%   BMI 41.57 kg/m  Wt Readings from Last 3 Encounters:  08/26/20 220 lb (99.8 kg)  09/02/19 217 lb (98.4 kg)  09/16/17 199 lb 9.6 oz (90.5 kg)     Health Maintenance Due  Topic Date Due   COVID-19 Vaccine (1) Never done   HIV Screening  Never done   Hepatitis C Screening  Never done   PAP SMEAR-Modifier  Never done   COLONOSCOPY (Pts 45-20yrs Insurance coverage will need to be confirmed)  Never done   Zoster Vaccines- Shingrix (1 of 2) Never done    There are no preventive care reminders to display for this patient.  No results found for: TSH Lab Results  Component Value Date   WBC 10.3 10/05/2019   HGB 11.6 (L) 10/05/2019   HCT 35.6 10/05/2019   MCV 89.0 10/05/2019   PLT 319 10/05/2019   Lab Results  Component Value Date   NA 137 09/02/2019   K 4.2 09/02/2019   CO2 27 09/02/2019   GLUCOSE 98 09/02/2019   BUN 14 09/02/2019   CREATININE 0.80 09/02/2019   BILITOT 0.6 09/02/2019   ALKPHOS 93 09/02/2019   AST 14 09/02/2019   ALT 11 09/02/2019   PROT 7.6 09/02/2019   ALBUMIN 4.0 09/02/2019   CALCIUM 9.2 09/02/2019   ANIONGAP 13 02/12/2014   GFR 90.97 09/02/2019   Lab Results  Component Value Date   CHOL 225 (H) 09/18/2016   Lab Results  Component Value Date   HDL 54.00 09/18/2016   Lab Results  Component Value Date   LDLCALC 149 (H) 09/18/2016   Lab Results  Component Value Date   TRIG 109.0 09/18/2016   Lab Results  Component Value Date   CHOLHDL 4 09/18/2016   No results found for: HGBA1C    Assessment & Plan:   Problem List Items Addressed  This Visit   None   No orders of the defined types  were placed in this encounter.   Follow-up: No follow-ups on file.   1. Sarcoidosis History as stated. Will send referral to rheumatology to discuss options. She did not want any prednisone today. Will check basic labs and inflammatory markers today as this has not been done since last year.  2. Essential hypertension Elevated BP in office today. Pt states she did not take her medication this morning (Lotrel 5-20 mg). She will monitor at home and alert [place] if consistently in 140s/90s. Low salt diet and plenty of water advised.  3. Morbid obesity (HCC) Referral to see Dr. Helane Rima per request of patient.    Basil Blakesley M Allani Reber, PA-C

## 2020-08-26 NOTE — Patient Instructions (Signed)
Labs today, results will be sent through MyChart Referral to Dr. Nickola Major, Rheum Referral to Dr. [name] Plater, Weight Management Monitor BP at home. If consistently in 140s/90s, please alert our office and we will need to look at other medication options.

## 2020-08-30 ENCOUNTER — Other Ambulatory Visit: Payer: Self-pay

## 2020-08-30 ENCOUNTER — Other Ambulatory Visit (INDEPENDENT_AMBULATORY_CARE_PROVIDER_SITE_OTHER): Payer: BC Managed Care – PPO

## 2020-08-30 DIAGNOSIS — R899 Unspecified abnormal finding in specimens from other organs, systems and tissues: Secondary | ICD-10-CM | POA: Diagnosis not present

## 2020-08-30 DIAGNOSIS — I10 Essential (primary) hypertension: Secondary | ICD-10-CM

## 2020-08-30 DIAGNOSIS — D869 Sarcoidosis, unspecified: Secondary | ICD-10-CM

## 2020-08-30 LAB — CBC WITH DIFFERENTIAL/PLATELET
Basophils Absolute: 0 10*3/uL (ref 0.0–0.1)
Basophils Relative: 0.4 % (ref 0.0–3.0)
Eosinophils Absolute: 0.5 10*3/uL (ref 0.0–0.7)
Eosinophils Relative: 5.7 % — ABNORMAL HIGH (ref 0.0–5.0)
HCT: 36.8 % (ref 36.0–46.0)
Hemoglobin: 12.2 g/dL (ref 12.0–15.0)
Lymphocytes Relative: 26.8 % (ref 12.0–46.0)
Lymphs Abs: 2.4 10*3/uL (ref 0.7–4.0)
MCHC: 33.1 g/dL (ref 30.0–36.0)
MCV: 89.8 fl (ref 78.0–100.0)
Monocytes Absolute: 0.4 10*3/uL (ref 0.1–1.0)
Monocytes Relative: 4.3 % (ref 3.0–12.0)
Neutro Abs: 5.5 10*3/uL (ref 1.4–7.7)
Neutrophils Relative %: 62.8 % (ref 43.0–77.0)
Platelets: 279 10*3/uL (ref 150.0–400.0)
RBC: 4.09 Mil/uL (ref 3.87–5.11)
RDW: 14.1 % (ref 11.5–15.5)
WBC: 8.8 10*3/uL (ref 4.0–10.5)

## 2020-08-30 LAB — COMPREHENSIVE METABOLIC PANEL
ALT: 17 U/L (ref 0–35)
AST: 23 U/L (ref 0–37)
Albumin: 4 g/dL (ref 3.5–5.2)
Alkaline Phosphatase: 86 U/L (ref 39–117)
BUN: 14 mg/dL (ref 6–23)
CO2: 26 mEq/L (ref 19–32)
Calcium: 9.5 mg/dL (ref 8.4–10.5)
Chloride: 104 mEq/L (ref 96–112)
Creatinine, Ser: 0.9 mg/dL (ref 0.40–1.20)
GFR: 73 mL/min (ref >60.00)
Glucose, Bld: 163 mg/dL — ABNORMAL HIGH (ref 70–99)
Potassium: 3.8 mEq/L (ref 3.5–5.1)
Sodium: 137 mEq/L (ref 135–145)
Total Bilirubin: 0.4 mg/dL (ref 0.2–1.2)
Total Protein: 7.7 g/dL (ref 6.0–8.3)

## 2020-08-30 LAB — SEDIMENTATION RATE: Sed Rate: 68 mm/hr — ABNORMAL HIGH (ref 0–30)

## 2020-08-30 LAB — C-REACTIVE PROTEIN: CRP: 1.5 mg/dL (ref 0.5–20.0)

## 2020-08-31 ENCOUNTER — Other Ambulatory Visit: Payer: Self-pay

## 2020-08-31 DIAGNOSIS — R899 Unspecified abnormal finding in specimens from other organs, systems and tissues: Secondary | ICD-10-CM

## 2020-08-31 NOTE — Addendum Note (Signed)
Addended by: Vincenza Hews on: 08/31/2020 10:09 AM   Modules accepted: Orders

## 2020-09-01 LAB — HEMOGLOBIN A1C: Hgb A1c MFr Bld: 6.6 % — ABNORMAL HIGH (ref 4.6–6.5)

## 2020-09-06 ENCOUNTER — Telehealth: Payer: Self-pay

## 2020-09-06 NOTE — Telephone Encounter (Signed)
Notified patient of lab results.Patient voices understanding.  

## 2020-09-06 NOTE — Telephone Encounter (Signed)
Patient is returning a call from Khamika about lab results.  

## 2020-09-06 NOTE — Telephone Encounter (Signed)
Left voice message for patient to call clinic.  

## 2020-09-13 ENCOUNTER — Other Ambulatory Visit: Payer: Self-pay | Admitting: Physician Assistant

## 2020-10-18 ENCOUNTER — Other Ambulatory Visit: Payer: Self-pay | Admitting: Family Medicine

## 2020-10-24 ENCOUNTER — Emergency Department (HOSPITAL_BASED_OUTPATIENT_CLINIC_OR_DEPARTMENT_OTHER): Payer: BC Managed Care – PPO | Admitting: Radiology

## 2020-10-24 ENCOUNTER — Emergency Department (HOSPITAL_BASED_OUTPATIENT_CLINIC_OR_DEPARTMENT_OTHER)
Admission: EM | Admit: 2020-10-24 | Discharge: 2020-10-24 | Disposition: A | Discharge status: Home / Self Care | Payer: BC Managed Care – PPO | Attending: Emergency Medicine | Admitting: Emergency Medicine

## 2020-10-24 ENCOUNTER — Telehealth: Payer: Self-pay

## 2020-10-24 ENCOUNTER — Other Ambulatory Visit: Payer: Self-pay

## 2020-10-24 ENCOUNTER — Ambulatory Visit (HOSPITAL_BASED_OUTPATIENT_CLINIC_OR_DEPARTMENT_OTHER)
Admission: RE | Admit: 2020-10-24 | Discharge: 2020-10-24 | Disposition: A | Discharge status: Home / Self Care | Payer: BC Managed Care – PPO | Source: Ambulatory Visit | Attending: Emergency Medicine | Admitting: Emergency Medicine

## 2020-10-24 ENCOUNTER — Encounter (HOSPITAL_BASED_OUTPATIENT_CLINIC_OR_DEPARTMENT_OTHER): Payer: Self-pay

## 2020-10-24 ENCOUNTER — Emergency Department (HOSPITAL_BASED_OUTPATIENT_CLINIC_OR_DEPARTMENT_OTHER): Payer: BC Managed Care – PPO

## 2020-10-24 DIAGNOSIS — R0789 Other chest pain: Secondary | ICD-10-CM | POA: Diagnosis not present

## 2020-10-24 DIAGNOSIS — I880 Nonspecific mesenteric lymphadenitis: Secondary | ICD-10-CM | POA: Diagnosis not present

## 2020-10-24 DIAGNOSIS — I10 Essential (primary) hypertension: Secondary | ICD-10-CM | POA: Diagnosis not present

## 2020-10-24 DIAGNOSIS — R1011 Right upper quadrant pain: Secondary | ICD-10-CM | POA: Insufficient documentation

## 2020-10-24 DIAGNOSIS — Z79899 Other long term (current) drug therapy: Secondary | ICD-10-CM | POA: Diagnosis not present

## 2020-10-24 LAB — CBC WITH DIFFERENTIAL/PLATELET
Abs Immature Granulocytes: 0.04 10*3/uL (ref 0.00–0.07)
Basophils Absolute: 0 10*3/uL (ref 0.0–0.1)
Basophils Relative: 0 %
Eosinophils Absolute: 0.7 10*3/uL — ABNORMAL HIGH (ref 0.0–0.5)
Eosinophils Relative: 6 %
HCT: 38.9 % (ref 36.0–46.0)
Hemoglobin: 12.6 g/dL (ref 12.0–15.0)
Immature Granulocytes: 0 %
Lymphocytes Relative: 21 %
Lymphs Abs: 2.5 10*3/uL (ref 0.7–4.0)
MCH: 29.6 pg (ref 26.0–34.0)
MCHC: 32.4 g/dL (ref 30.0–36.0)
MCV: 91.5 fL (ref 80.0–100.0)
Monocytes Absolute: 0.7 10*3/uL (ref 0.1–1.0)
Monocytes Relative: 6 %
Neutro Abs: 7.8 10*3/uL — ABNORMAL HIGH (ref 1.7–7.7)
Neutrophils Relative %: 67 %
Platelets: 262 10*3/uL (ref 150–400)
RBC: 4.25 MIL/uL (ref 3.87–5.11)
RDW: 13.4 % (ref 11.5–15.5)
WBC: 11.7 10*3/uL — ABNORMAL HIGH (ref 4.0–10.5)
nRBC: 0 % (ref 0.0–0.2)

## 2020-10-24 LAB — COMPREHENSIVE METABOLIC PANEL WITH GFR
ALT: 10 U/L (ref 0–44)
AST: 14 U/L — ABNORMAL LOW (ref 15–41)
Albumin: 4 g/dL (ref 3.5–5.0)
Alkaline Phosphatase: 69 U/L (ref 38–126)
Anion gap: 9 (ref 5–15)
BUN: 13 mg/dL (ref 6–20)
CO2: 24 mmol/L (ref 22–32)
Calcium: 9.5 mg/dL (ref 8.9–10.3)
Chloride: 105 mmol/L (ref 98–111)
Creatinine, Ser: 0.8 mg/dL (ref 0.44–1.00)
GFR, Estimated: >60 mL/min
Glucose, Bld: 110 mg/dL — ABNORMAL HIGH (ref 70–99)
Potassium: 3.6 mmol/L (ref 3.5–5.1)
Sodium: 138 mmol/L (ref 135–145)
Total Bilirubin: 0.7 mg/dL (ref 0.3–1.2)
Total Protein: 7.8 g/dL (ref 6.5–8.1)

## 2020-10-24 LAB — TROPONIN I (HIGH SENSITIVITY)
Troponin I (High Sensitivity): 4 ng/L (ref <18)
Troponin I (High Sensitivity): 3 ng/L

## 2020-10-24 LAB — LIPASE, BLOOD: Lipase: 40 U/L (ref 11–51)

## 2020-10-24 MED ORDER — MORPHINE SULFATE (PF) 4 MG/ML IV SOLN
4.0000 mg | Freq: Once | INTRAVENOUS | Status: AC
  Administered 2020-10-24: 4 mg via INTRAVENOUS
  Filled 2020-10-24: qty 1

## 2020-10-24 MED ORDER — IBUPROFEN 800 MG PO TABS
ORAL_TABLET | ORAL | Status: AC
  Filled 2020-10-24: qty 1

## 2020-10-24 MED ORDER — IOHEXOL 350 MG/ML SOLN
75.0000 mL | Freq: Once | INTRAVENOUS | Status: AC | PRN
  Administered 2020-10-24: 75 mL via INTRAVENOUS

## 2020-10-24 MED ORDER — ONDANSETRON HCL 4 MG/2ML IJ SOLN
4.0000 mg | Freq: Once | INTRAMUSCULAR | Status: AC
  Administered 2020-10-24: 4 mg via INTRAVENOUS
  Filled 2020-10-24: qty 2

## 2020-10-24 MED ORDER — ONDANSETRON 4 MG PO TBDP: 4.0000 mg | ORAL_TABLET | Freq: Three times a day (TID) | ORAL | 0 refills | Status: DC | PRN

## 2020-10-24 MED ORDER — HYDROCODONE-ACETAMINOPHEN 5-325 MG PO TABS: 1.0000 | ORAL_TABLET | Freq: Four times a day (QID) | ORAL | 0 refills | Status: DC | PRN

## 2020-10-24 NOTE — Telephone Encounter (Signed)
Patient's husband called in stating that Korey is in the ED currently, states they want Lelon Mast to look at them as well as schedule a follow up. No available appointments and patient has not been discharged yet. Please advise.

## 2020-10-24 NOTE — ED Notes (Signed)
Patient transported to CT 

## 2020-10-24 NOTE — ED Notes (Signed)
Pt verbalizes understanding of discharge instructions. Opportunity for questioning and answers were provided. Armand removed by staff, pt discharged from ED to home. [place] scheduled for noon today.

## 2020-10-24 NOTE — ED Triage Notes (Signed)
Pt is present for sharp right sided chest pain that radiates to her back that started yesterday morning. Sx are worse when lying flat. Denies SOB/N/V. Hx of pericarditis.

## 2020-10-24 NOTE — Discharge Instructions (Addendum)
You were seen today for abdominal pain.  You do have some changes on your CT scan in the mesentery which may reflect some inflammation.  This could be causing her pain.  CT did not show any changes related to your gallbladder although this is also in the region.  Return for ultrasound for full evaluation of her gallbladder.  In the meantime take anti-inflammatory medicine such as ibuprofen.  You may take Norco for breakthrough pain.  If any of your symptoms worsen, you should be reevaluated.

## 2020-10-24 NOTE — ED Provider Notes (Signed)
[facility] EMERGENCY DEPT Provider Note   CSN: [id number] Arrival date & time: 10/24/20  0100     History Chief Complaint  Patient presents with   Chest Pain   Abdominal Pain    Christina Poole is a 55 y.o. female.  HPI     This a 55 year old female with a history of hypertension, pericarditis, sarcoidosis who presents with right upper quadrant pain.  Patient reports sharp right upper quadrant and chest pain that radiates to her right shoulder.  Started yesterday morning.  States nothing makes her pain better.  Nothing seems to make it worse.  She has had pericarditis in the past but this seems somewhat different.  She denies nausea, vomiting, change in bowels, shortness of breath, chest pain.  She denies fevers.  She rates her pain at 10 out of 10.  She is not taking anything for her pain.  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     Family History  Problem Relation Age of Onset   Colon cancer Father        in his 71s   Diabetes Mother    Hypercholesterolemia Mother     Social History   Tobacco Use   Smoking status: Never   Smokeless tobacco: Never  Vaping Use   Vaping Use: Never used  Substance Use Topics   Alcohol use: No   Drug use: No    Home Medications Prior to Admission medications   Medication Sig Start Date End Date Taking?  Authorizing Provider  HYDROcodone-acetaminophen (NORCO/VICODIN) 5-325 MG tablet Take 1 tablet by mouth every 6 (six) hours as needed. 10/24/20  Yes Hajar Penninger, Mayer Masker, MD  ondansetron (ZOFRAN ODT) 4 MG disintegrating tablet Take 1 tablet (4 mg total) by mouth every 8 (eight) hours as needed for nausea or vomiting. 10/24/20  Yes Saray Capasso, Mayer Masker, MD  amLODipine-benazepril (LOTREL) 5-20 MG capsule TAKE 1 CAPSULE BY MOUTH EVERY DAY 10/18/20   Jarold Motto, PA  cholecalciferol (VITAMIN D) 1000 units tablet Take 2,000 Units by mouth daily.    [provider]  Multiple Vitamins-Minerals (ONE DAILY CALCIUM/IRON) TABS Take by mouth.    [provider]    Allergies    Codeine  Review of Systems   Review of Systems  Constitutional:  Negative for fever.  Respiratory:  Negative for shortness of breath.   Cardiovascular:  Negative for chest pain.  Gastrointestinal:  Positive for abdominal pain. Negative for constipation, diarrhea, nausea and vomiting.  Genitourinary:  Negative for dysuria.  All other systems reviewed and are negative.  Physical Exam Updated Vital Signs BP (!) 152/101   Pulse 83   Temp 98.4 F (36.9 C) (Oral)   Resp 15   Ht 1.549 m (5\' 1" )   Wt 99.3 kg   SpO2  94%   BMI 41.38 kg/m   Physical Exam Vitals and nursing note reviewed.  Constitutional:      Appearance: She is well-developed. She is obese. She is not ill-appearing.  HENT:     Head: Normocephalic and atraumatic.  Eyes:     Pupils: Pupils are equal, round, and reactive to light.  Cardiovascular:     Rate and Rhythm: Normal rate and regular rhythm.     Heart sounds: Normal heart sounds.  Pulmonary:     Effort: Pulmonary effort is normal. No respiratory distress.     Breath sounds: No wheezing.  Abdominal:     General: Bowel sounds are normal.     Palpations: Abdomen is soft.     Tenderness: There is abdominal tenderness. There is no guarding or rebound.  Musculoskeletal:     Cervical  back: Neck supple.  Skin:    General: Skin is warm and dry.  Neurological:     Mental Status: She is alert and oriented to person, place, and time.  Psychiatric:        Mood and Affect: Mood normal.    ED Results / Procedures / Treatments   Labs (all labs ordered are listed, but only abnormal results are displayed) Labs Reviewed  CBC WITH DIFFERENTIAL/PLATELET - Abnormal; Notable for the following components:      Result Value   WBC 11.7 (*)    Neutro Abs 7.8 (*)    Eosinophils Absolute 0.7 (*)    All other components within normal limits  COMPREHENSIVE METABOLIC PANEL - Abnormal; Notable for the following components:   Glucose, Bld 110 (*)    AST 14 (*)    All other components within normal limits  LIPASE, BLOOD  TROPONIN I (HIGH SENSITIVITY)  TROPONIN I (HIGH SENSITIVITY)    EKG EKG Interpretation  Date/Time:  Monday October 24 2020 01:24:17 EDT Ventricular Rate:  84 PR Interval:  165 QRS Duration: 76 QT Interval:  352 QTC Calculation: 416 R Axis:   21 Text Interpretation: Sinus rhythm Low voltage, precordial leads Abnormal R-wave progression, early transition Borderline T wave abnormalities Confirmed by Ross MarcusHorton, Riyanna Crutchley ([id number]) on 10/24/2020 1:59:01 AM  Radiology CT ABDOMEN PELVIS W CONTRAST  Result Date: 10/24/2020 CLINICAL DATA:  Epigastric pain. EXAM: CT ABDOMEN AND PELVIS WITH CONTRAST TECHNIQUE: Multidetector CT imaging of the abdomen and pelvis was performed using the standard protocol following bolus administration of intravenous contrast. CONTRAST:  75mL OMNIPAQUE IOHEXOL 350 MG/ML SOLN COMPARISON:  None. FINDINGS: Lower chest: Bibasilar linear atelectasis. No intra-abdominal free air or free fluid. Hepatobiliary: Slight irregularity of the liver contour may represent early changes of cirrhosis. Clinical correlation is recommended. No intrahepatic biliary ductal dilatation. The gallbladder is unremarkable. Pancreas: Unremarkable. No pancreatic ductal dilatation or  surrounding inflammatory changes. Spleen: Normal in size without focal abnormality. A small splenule at the splenic hilum and tail of the pancreas. Adrenals/Urinary Tract: The adrenal glands unremarkable. There is no hydronephrosis on either side. There is symmetric enhancement and excretion of contrast by both kidneys. The visualized ureters and urinary bladder appear unremarkable. Stomach/Bowel: Several normal caliber fecalized loops of small bowel in the right lower abdomen may represent increased transit time or small intestinal bacterial overgrowth. There is no bowel obstruction or active inflammation. The appendix is normal. Vascular/Lymphatic: Mild aortoiliac atherosclerotic disease. The IVC is unremarkable. No pain venous gas. There is no adenopathy. Reproductive: The uterus is anteverted. There is a 7.2 cm right uterine body fibroid with mass effect and compression of  the endometrium. Several small calcified fibroids noted. Other: There is stranding and inflammatory changes of the mesentery in the right upper quadrant anterior and slightly inferior to the liver. This is of indeterminate etiology but may represent an area of mesenteric infarct secondary to trauma or twisting. Metastatic implant is not excluded. Clinical correlation is recommended. No drainable fluid collection Musculoskeletal: No acute or significant osseous findings. IMPRESSION: 1. Inflammatory changes of the mesentery in the right upper quadrant anterior and slightly inferior to the liver. This may represent an area of mesenteric infarct secondary to trauma or twisting. Metastatic implant is not excluded. Clinical correlation is recommended. No fluid collection. 2. No bowel obstruction. Normal appendix. 3. Large right uterine body fibroid with mass effect and compression of the endometrium. 4. Aortic Atherosclerosis (ICD10-I70.0). Electronically Signed   By: Elgie Collard M.D.   On: 10/24/2020 03:56   DG Chest Portable 1  View  Result Date: 10/24/2020 CLINICAL DATA:  Chest pain. EXAM: PORTABLE CHEST 1 VIEW COMPARISON:  Chest radiograph dated 04/08/2017. FINDINGS: No focal consolidation, pleural effusion or pneumothorax. Stable mild cardiomegaly. No acute pathology. IMPRESSION: No active disease. Electronically Signed   By: Elgie Collard M.D.   On: 10/24/2020 02:15    Procedures Procedures   Medications Ordered in ED Medications  morphine 4 MG/ML injection 4 mg (4 mg Intravenous Given 10/24/20 0148)  ondansetron (ZOFRAN) injection 4 mg (4 mg Intravenous Given 10/24/20 0148)  iohexol (OMNIPAQUE) 350 MG/ML injection 75 mL (75 mLs Intravenous Contrast Given 10/24/20 2774)    ED Course  I have reviewed the triage vital signs and the nursing notes.  Pertinent labs & imaging results that were available during my care of the patient were reviewed by me and considered in my medical decision making (see chart for details).  Clinical Course as of 10/24/20 0450  Mon Oct 24, 2020  0411 Spoke with Dr. Donell Beers regarding CT findings.  Recommends treating clinically.  If pain is well controlled, patient can be treated as an outpatient. [CH]  0415 Patient's pain well controlled.  P.o. challenge. [CH]    Clinical Course User Index [CH] Sanaz Scarlett, Mayer Masker, MD   MDM Rules/Calculators/A&P                           Patient presents with right upper quadrant pain.  She is overall nontoxic and vital signs are reassuring.  She is fairly tender in the right upper quadrant with referred pain to the right shoulder.  Considerations include but not limited to, gallbladder pathology, pancreatitis, pneumonia.  Patient was given pain and nausea medication.  Slight leukocytosis to 11.7.  Labs reassuring including lipase and LFTs.  Do not have access to ultrasound at this hour.  We will obtain a CT scan.  CT shows some stranding of the mesentery in the right upper quadrant.  This could reflect inflammation/infarction.  Metastatic disease  also consideration although patient without any known cancer and no other lesions noted on her abdominal film.  I did discuss with general surgery.  Felt this was likely inflammatory in nature.  We will treat like mesenteric adenitis.  Does recommend full evaluation the gallbladder with ultrasound.  On recheck, patient states she feels much better and pain is controlled.  We will order outpatient ultrasound for later today.  In the meantime we will treat with anti-inflammatories and Norco for breakthrough pain at home.  After history, exam, and medical workup I feel the patient  has been appropriately medically screened and is safe for discharge home. Pertinent diagnoses were discussed with the patient. Patient was given return precautions.  Final Clinical Impression(s) / ED Diagnoses Final diagnoses:  RUQ pain  Mesenteric adenitis    Rx / DC Orders ED Discharge Orders          Ordered    HYDROcodone-acetaminophen (NORCO/VICODIN) 5-325 MG tablet  Every 6 hours PRN        10/24/20 0449    ondansetron (ZOFRAN ODT) 4 MG disintegrating tablet  Every 8 hours PRN        10/24/20 0449    US Abdomen Limited RUQ/Gall Bladder        10/24/20 0449             Marki Frede, Mayer Masker, MD 10/24/20 [phone]

## 2020-10-24 NOTE — ED Notes (Signed)
Patient returned from CT

## 2020-10-24 NOTE — ED Notes (Signed)
Pt tolerated PO fluids

## 2020-10-25 ENCOUNTER — Telehealth: Payer: Self-pay

## 2020-10-25 NOTE — Telephone Encounter (Signed)
LVM for pt to call the office back to schedule with Sam.

## 2020-10-25 NOTE — Telephone Encounter (Signed)
Sam's next appt is 9/7. Can Elnoria wait until then? Should we use a sam day spot for something before then?

## 2020-10-25 NOTE — Telephone Encounter (Signed)
Noted  

## 2020-10-25 NOTE — Telephone Encounter (Signed)
Pt is scheduled for 9/1 with Samantha.

## 2020-10-25 NOTE — Telephone Encounter (Signed)
Pt's husband called back and asked to speak with manager, Lattie Corns was in meeting, I spoke with him. I explained to him that sam was out of the office yesterday; I went over Sam's message that pt need to schedule an appointment with her to go over those labs and imaging results. He agreed to schedule an appointment, I inform him that's first available slot with Sam is on Thursday 9/1, he asked that I will check with Sam and see if she can fit her in any sooner if not we will schedule her on Thursday. Sam's schedule is booked for the next 2 days and she can't fit her in any sooner. Duwayne Heck will call pt and offer the slot on Thursday and let him know that if we have any cancellation with sam we will call him and move her sooner.

## 2020-10-25 NOTE — Telephone Encounter (Signed)
Yes, please use a same day slot.

## 2020-10-25 NOTE — Telephone Encounter (Signed)
Please call pt and schedule an appointment to discuss x-rays that were ordered in the ED.

## 2020-10-25 NOTE — Telephone Encounter (Signed)
Pt's husband called to see if anyone has reviewed her x-rays. He would like a call as soon as they are reviewed. Please Advise.

## 2020-10-25 NOTE — Telephone Encounter (Signed)
Pt's husband called to check on X-ray results. I told pt that a nurse will call a soon as they reviewed. Pt's husband then asked if we can send x-rays to her gastro doctor with [facility]. I told him that Deneka would have to sign an ROI or [facility] would have to request the results. Pt's husband then got upset that they have been waiting a day for the results.

## 2020-10-27 ENCOUNTER — Encounter (HOSPITAL_BASED_OUTPATIENT_CLINIC_OR_DEPARTMENT_OTHER): Payer: Self-pay | Admitting: Emergency Medicine

## 2020-10-27 ENCOUNTER — Other Ambulatory Visit: Payer: Self-pay

## 2020-10-27 ENCOUNTER — Inpatient Hospital Stay (HOSPITAL_BASED_OUTPATIENT_CLINIC_OR_DEPARTMENT_OTHER)
Admission: EM | Admit: 2020-10-27 | Discharge: 2020-10-29 | DRG: 395 | Disposition: A | Discharge status: Home / Self Care | Payer: BC Managed Care – PPO | Attending: Family Medicine | Admitting: Family Medicine

## 2020-10-27 ENCOUNTER — Emergency Department (HOSPITAL_BASED_OUTPATIENT_CLINIC_OR_DEPARTMENT_OTHER): Payer: BC Managed Care – PPO

## 2020-10-27 ENCOUNTER — Encounter: Payer: Self-pay | Admitting: Physician Assistant

## 2020-10-27 ENCOUNTER — Ambulatory Visit: Payer: BC Managed Care – PPO | Admitting: Physician Assistant

## 2020-10-27 VITALS — BP 195/99 | HR 87 | Temp 98.9°F | Ht 61.0 in | Wt 216.0 lb

## 2020-10-27 DIAGNOSIS — D869 Sarcoidosis, unspecified: Secondary | ICD-10-CM | POA: Diagnosis present

## 2020-10-27 DIAGNOSIS — I10 Essential (primary) hypertension: Secondary | ICD-10-CM | POA: Diagnosis present

## 2020-10-27 DIAGNOSIS — K55069 Acute infarction of intestine, part and extent unspecified: Principal | ICD-10-CM | POA: Diagnosis present

## 2020-10-27 DIAGNOSIS — Z79899 Other long term (current) drug therapy: Secondary | ICD-10-CM

## 2020-10-27 DIAGNOSIS — R1011 Right upper quadrant pain: Secondary | ICD-10-CM

## 2020-10-27 DIAGNOSIS — Z6836 Body mass index (BMI) 36.0-36.9, adult: Secondary | ICD-10-CM

## 2020-10-27 DIAGNOSIS — K529 Noninfective gastroenteritis and colitis, unspecified: Secondary | ICD-10-CM | POA: Diagnosis present

## 2020-10-27 DIAGNOSIS — G4733 Obstructive sleep apnea (adult) (pediatric): Secondary | ICD-10-CM | POA: Diagnosis present

## 2020-10-27 DIAGNOSIS — Z885 Allergy status to narcotic agent status: Secondary | ICD-10-CM

## 2020-10-27 DIAGNOSIS — Z20822 Contact with and (suspected) exposure to covid-19: Secondary | ICD-10-CM | POA: Diagnosis present

## 2020-10-27 DIAGNOSIS — E669 Obesity, unspecified: Secondary | ICD-10-CM | POA: Diagnosis present

## 2020-10-27 DIAGNOSIS — R21 Rash and other nonspecific skin eruption: Secondary | ICD-10-CM | POA: Diagnosis present

## 2020-10-27 DIAGNOSIS — R109 Unspecified abdominal pain: Secondary | ICD-10-CM

## 2020-10-27 LAB — CBC
HCT: 38.7 % (ref 36.0–46.0)
Hemoglobin: 12.7 g/dL (ref 12.0–15.0)
MCH: 29.5 pg (ref 26.0–34.0)
MCHC: 32.8 g/dL (ref 30.0–36.0)
MCV: 89.8 fL (ref 80.0–100.0)
Platelets: 332 10*3/uL (ref 150–400)
RBC: 4.31 MIL/uL (ref 3.87–5.11)
RDW: 13.2 % (ref 11.5–15.5)
WBC: 12.8 10*3/uL — ABNORMAL HIGH (ref 4.0–10.5)
nRBC: 0 % (ref 0.0–0.2)

## 2020-10-27 LAB — URINALYSIS, ROUTINE W REFLEX MICROSCOPIC
Bilirubin Urine: NEGATIVE
Glucose, UA: NEGATIVE mg/dL
Hgb urine dipstick: NEGATIVE
Ketones, ur: NEGATIVE mg/dL
Leukocytes,Ua: NEGATIVE
Nitrite: NEGATIVE
Protein, ur: 30 mg/dL — AB
Specific Gravity, Urine: >1.046 — ABNORMAL HIGH (ref 1.005–1.030)
pH: 6 (ref 5.0–8.0)

## 2020-10-27 LAB — RESP PANEL BY RT-PCR (FLU A&B, COVID) ARPGX2
Influenza A by PCR: NEGATIVE
Influenza B by PCR: NEGATIVE
SARS Coronavirus 2 by RT PCR: NEGATIVE

## 2020-10-27 LAB — COMPREHENSIVE METABOLIC PANEL
ALT: 15 U/L (ref 0–44)
AST: 19 U/L (ref 15–41)
Albumin: 3.5 g/dL (ref 3.5–5.0)
Alkaline Phosphatase: 74 U/L (ref 38–126)
Anion gap: 9 (ref 5–15)
BUN: 16 mg/dL (ref 6–20)
CO2: 23 mmol/L (ref 22–32)
Calcium: 8.7 mg/dL — ABNORMAL LOW (ref 8.9–10.3)
Chloride: 108 mmol/L (ref 98–111)
Creatinine, Ser: 0.65 mg/dL (ref 0.44–1.00)
GFR, Estimated: >60 mL/min (ref >60)
Glucose, Bld: 91 mg/dL (ref 70–99)
Potassium: 3.8 mmol/L (ref 3.5–5.1)
Sodium: 140 mmol/L (ref 135–145)
Total Bilirubin: 0.4 mg/dL (ref 0.3–1.2)
Total Protein: 7.3 g/dL (ref 6.5–8.1)

## 2020-10-27 LAB — PREGNANCY, URINE: Preg Test, Ur: NEGATIVE

## 2020-10-27 LAB — LIPASE, BLOOD: Lipase: 17 U/L (ref 11–51)

## 2020-10-27 MED ORDER — PIPERACILLIN-TAZOBACTAM 3.375 G IVPB 30 MIN
3.3750 g | Freq: Once | INTRAVENOUS | Status: AC
  Administered 2020-10-27: 3.375 g via INTRAVENOUS
  Filled 2020-10-27: qty 50

## 2020-10-27 MED ORDER — HYDROMORPHONE HCL 1 MG/ML IJ SOLN
1.0000 mg | Freq: Once | INTRAMUSCULAR | Status: AC
  Administered 2020-10-27: 1 mg via INTRAVENOUS
  Filled 2020-10-27: qty 1

## 2020-10-27 MED ORDER — ACETAMINOPHEN 325 MG PO TABS
650.0000 mg | ORAL_TABLET | Freq: Once | ORAL | Status: AC
  Administered 2020-10-27: 650 mg via ORAL
  Filled 2020-10-27: qty 2

## 2020-10-27 MED ORDER — IOHEXOL 350 MG/ML SOLN
100.0000 mL | Freq: Once | INTRAVENOUS | Status: AC | PRN
  Administered 2020-10-27: 100 mL via INTRAVENOUS

## 2020-10-27 MED ORDER — ONDANSETRON HCL 4 MG/2ML IJ SOLN
4.0000 mg | Freq: Once | INTRAMUSCULAR | Status: AC
  Administered 2020-10-27: 4 mg via INTRAVENOUS
  Filled 2020-10-27: qty 2

## 2020-10-27 NOTE — Patient Instructions (Signed)
It was great to see you!  Due to worsening, uncontrolled pain and ongoing hypertension, needs further evaluation and monitoring at the ER to figure out more information.  Please go to [employer].  Take care,  Jarold Motto PA-C

## 2020-10-27 NOTE — Progress Notes (Signed)
Christina Poole is a 55 y.o. female here for a follow up of a pre-existing problem.  History of Present Illness:   Chief Complaint  Patient presents with   Follow-up    ED visit; 10/24/2020  Chest Pain, RUQ pain. Pt was given Hydrocodone and nausea.      HPI  RUQ Pain: She presents today for ED follow up. She presented to the  ED on 10/24/2020  for right upper quadrant pain that started 10/23/20. She described pain as sharp associated with chest pain that radiates to her right shoulder.  She was given nausea and pain medication. Also CT scan was done and it showed some standing of the mesentery in the right upper quadrant. They felt like it was likely due to inflammatory in nature and it was treated as a mesenteric adenitis. She was also treated with anti inflammatory and Norco for pain at home. She has not taken Norco due to fear of becoming dependent on this. Surgery was consulted during ER visit and they recommended to treat with anti-inflammatories for possible adenitis.  She is accompanied by her husband. She is currently in severe pain 10/10. Abdominal sx unchanged since ER. Hurts to laugh, walk, breath, cough. She states her shoulder hurts constantly and is worse than when she presented to the ER. Tylenol and ibuprofen are not helping.    Past Medical History:  Diagnosis Date   Colitis    Hypertension    Pericarditis 2007   Sarcoidosis      Social History   Tobacco Use   Smoking status: Never   Smokeless tobacco: Never  Vaping Use   Vaping Use: Never used  Substance Use Topics   Alcohol use: No   Drug use: No    Past Surgical History:  Procedure Laterality Date   APPENDECTOMY     CESAREAN SECTION     MYOMECTOMY      Family History  Problem Relation Age of Onset   Colon cancer Father        in his 25s   Diabetes Mother    Hypercholesterolemia Mother     Allergies  Allergen Reactions   Codeine     Severe unrelenting nausea    Current Medications:    Current Outpatient Medications:    amLODipine-benazepril (LOTREL) 5-20 MG capsule, TAKE 1 CAPSULE BY MOUTH EVERY DAY, Disp: 30 capsule, Rfl: 0   cholecalciferol (VITAMIN D) 1000 units tablet, Take 2,000 Units by mouth daily., Disp: , Rfl:    HYDROcodone-acetaminophen (NORCO/VICODIN) 5-325 MG tablet, Take 1 tablet by mouth every 6 (six) hours as needed., Disp: 10 tablet, Rfl: 0   Multiple Vitamins-Minerals (ONE DAILY CALCIUM/IRON) TABS, Take by mouth., Disp: , Rfl:    ondansetron (ZOFRAN ODT) 4 MG disintegrating tablet, Take 1 tablet (4 mg total) by mouth every 8 (eight) hours as needed for nausea or vomiting., Disp: 20 tablet, Rfl: 0   Review of Systems:   ROS Negative unless otherwise specified per HPI.  Vitals:   Vitals:   10/27/20 1118  BP: (!) 195/99  Pulse: 87  Temp: 98.9 F (37.2 C)  TempSrc: Temporal  SpO2: 97%  Weight: 216 lb (98 kg)  Height: 5\' 1"  (1.549 m)     Body mass index is 40.81 kg/m.  Physical Exam:   Physical Exam Vitals and nursing note reviewed.  Constitutional:      General: She is not in acute distress.    Appearance: She is well-developed. She is obese. She is  ill-appearing. She is not toxic-appearing.  HENT:     Head: Normocephalic and atraumatic.     Right Ear: Tympanic membrane, ear canal and external ear normal. Tympanic membrane is not erythematous, retracted or bulging.     Left Ear: Tympanic membrane, ear canal and external ear normal. Tympanic membrane is not erythematous, retracted or bulging.     Nose: Nose normal.     Right Sinus: No maxillary sinus tenderness or frontal sinus tenderness.     Left Sinus: No maxillary sinus tenderness or frontal sinus tenderness.     Mouth/Throat:     Pharynx: Uvula midline. No posterior oropharyngeal erythema.  Eyes:     General: Lids are normal.     Conjunctiva/sclera: Conjunctivae normal.  Neck:     Trachea: Trachea normal.  Cardiovascular:     Rate and Rhythm: Normal rate and regular rhythm.      Heart sounds: Normal heart sounds, S1 normal and S2 normal.  Pulmonary:     Effort: Pulmonary effort is normal.     Breath sounds: Normal breath sounds. No decreased breath sounds, wheezing, rhonchi or rales.  Abdominal:     Tenderness: There is generalized abdominal tenderness. There is guarding.     Comments: Limiting physical exam due to patient extreme pain.  Lymphadenopathy:     Cervical: No cervical adenopathy.  Skin:    General: Skin is warm and dry.  Neurological:     Mental Status: She is alert.  Psychiatric:        Speech: Speech normal.        Behavior: Behavior normal. Behavior is cooperative.      Assessment and Plan:   1. RUQ pain Uncontrolled She appears quite uncomfortable today Limited exam due to severity of pain BP very uncontrolled due to pain Due to worsening symptoms and lack of clear understanding of etiology of pain, will refer to ER Patient and husband verbalized understanding  I,Savera Zaman,acting as a scribe for [employer], PA.,have documented all relevant documentation on the behalf of Jarold Motto, PA,as directed by  Jarold Motto, PA while in the presence of Jarold Motto, [place].   I, Jarold Motto, [place], have reviewed all documentation for this visit. The documentation on 10/27/20 for the exam, diagnosis, procedures, and orders are all accurate and complete.  Time spent with patient today was 30 minutes which consisted of chart review, discussing diagnosis, work up, treatment answering questions and documentation.   Jarold Motto, PA-C

## 2020-10-27 NOTE — ED Provider Notes (Signed)
[facility] EMERGENCY DEPT Provider Note   CSN: [id number] Arrival date & time: 10/27/20  1213     History Chief Complaint  Patient presents with   Abdominal Pain    Christina Poole is a 55 y.o. female.  55 year old female presents with several days of worsening right upper quadrant abdominal discomfort.  Seen recently for same and diagnosed with adenitis.  Continues to have pain is worse with movement and radiates to her right shoulder.  Denies any cough congestion.  She denies nausea vomiting or diarrhea.  She is not short of breath.  Was seen by her provider today for follow-up appointment and due to her continued pain and sent here for further management.  She denies any urinary symptoms.  No prior history of same.      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     Family History  Problem Relation Age of Onset   Colon cancer Father        in his 58s   Diabetes Mother    Hypercholesterolemia Mother     Social History   Tobacco Use   Smoking status: Never   Smokeless tobacco: Never  Vaping Use   Vaping Use: Never used  Substance Use Topics   Alcohol use: No   Drug use: No    Home Medications Prior to Admission medications   Medication Sig Start Date End Date Taking? Authorizing Provider  amLODipine-benazepril (LOTREL) 5-20 MG capsule TAKE 1  CAPSULE BY MOUTH EVERY DAY 10/18/20  Yes Jarold Motto, PA  cholecalciferol (VITAMIN D) 1000 units tablet Take 2,000 Units by mouth daily.   Yes [provider]  HYDROcodone-acetaminophen (NORCO/VICODIN) 5-325 MG tablet Take 1 tablet by mouth every 6 (six) hours as needed. 10/24/20  Yes Horton, Mayer Masker, MD  Multiple Vitamins-Minerals (ONE DAILY CALCIUM/IRON) TABS Take by mouth.   Yes [provider]  ondansetron (ZOFRAN ODT) 4 MG disintegrating tablet Take 1 tablet (4 mg total) by mouth every 8 (eight) hours as needed for nausea or vomiting. 10/24/20  Yes Horton, Mayer Masker, MD    Allergies    Codeine  Review of Systems   Review of Systems  All other systems reviewed and are negative.  Physical Exam Updated Vital Signs BP (!) 165/115 (BP Location: Right Arm)   Pulse 71   Temp 98.4 F (36.9 C)   Resp 20   Ht 1.549 m (5\' 1" )   Wt 98 kg   SpO2 100%   BMI 40.81 kg/m   Physical Exam Vitals and nursing note reviewed.  Constitutional:      General: She is not in acute distress.    Appearance: Normal appearance. She is well-developed. She is not toxic-appearing.  HENT:     Head: Normocephalic and atraumatic.  Eyes:  General: Lids are normal.     Conjunctiva/sclera: Conjunctivae normal.     Pupils: Pupils are equal, round, and reactive to light.  Neck:     Thyroid: No thyroid mass.     Trachea: No tracheal deviation.  Cardiovascular:     Rate and Rhythm: Normal rate and regular rhythm.     Heart sounds: Normal heart sounds. No murmur heard.   No gallop.  Pulmonary:     Effort: Pulmonary effort is normal. No respiratory distress.     Breath sounds: Normal breath sounds. No stridor. No decreased breath sounds, wheezing, rhonchi or rales.  Abdominal:     General: There is no distension.     Palpations: Abdomen is soft.     Tenderness: There is abdominal tenderness in the right upper quadrant. There is guarding. There is no rebound.  Musculoskeletal:         General: No tenderness. Normal range of motion.     Cervical back: Normal range of motion and neck supple.  Skin:    General: Skin is warm and dry.     Findings: No abrasion or rash.  Neurological:     Mental Status: She is alert and oriented to person, place, and time. Mental status is at baseline.     GCS: GCS eye subscore is 4. GCS verbal subscore is 5. GCS motor subscore is 6.     Cranial Nerves: Cranial nerves are intact. No cranial nerve deficit.     Sensory: No sensory deficit.     Motor: Motor function is intact.  Psychiatric:        Attention and Perception: Attention normal.        Mood and Affect: Mood is anxious.        Speech: Speech normal.        Behavior: Behavior normal.    ED Results / Procedures / Treatments   Labs (all labs ordered are listed, but only abnormal results are displayed) Labs Reviewed  CBC - Abnormal; Notable for the following components:      Result Value   WBC 12.8 (*)    All other components within normal limits  URINALYSIS, ROUTINE W REFLEX MICROSCOPIC  PREGNANCY, URINE  COMPREHENSIVE METABOLIC PANEL  LIPASE, BLOOD    EKG None  Radiology No results found.  Procedures Procedures   Medications Ordered in ED Medications  HYDROmorphone (DILAUDID) injection 1 mg (has no administration in time range)  ondansetron (ZOFRAN) injection 4 mg (has no administration in time range)    ED Course  I have reviewed the triage vital signs and the nursing notes.  Pertinent labs & imaging results that were available during my care of the patient were reviewed by me and considered in my medical decision making (see chart for details).    MDM Rules/Calculators/A&P                           Patient with mild leukocytosis of 12.8 thousand.  Abdominal CT shows omental infarct.  Case discussed with Dr. Janee Morn from general surgery.  Recommends patient started on IV Zosyn and medicine admit.  They will follow in consultation Final Clinical  Impression(s) / ED Diagnoses Final diagnoses:  None    Rx / DC Orders ED Discharge Orders     None        Lorre Nick, MD 10/27/20 2026

## 2020-10-27 NOTE — ED Notes (Signed)
RUQ abdomen pian onset  Over the weekend.  States seen here on Mondays.  Acute pain continues and worsening.  Denies nausea or vomiting.  Unable to ambulate due to pain

## 2020-10-27 NOTE — Plan of Care (Signed)
[facility] will assume care on arrival to accepting facility. Until arrival, care as per EDP. However, [facility] available 24/7 for questions and assistance.  Nursing staff please page [facility] Admits and Consults ([phone]) as soon as the patient arrives the hospital.   

## 2020-10-27 NOTE — ED Triage Notes (Signed)
Seen here on Monday for same rt abd pain and shoulder pain , denies n/v/d  no sob

## 2020-10-27 NOTE — ED Notes (Signed)
Patient transported to CT 

## 2020-10-28 ENCOUNTER — Encounter (HOSPITAL_COMMUNITY): Payer: Self-pay | Admitting: Internal Medicine

## 2020-10-28 DIAGNOSIS — K55069 Acute infarction of intestine, part and extent unspecified: Principal | ICD-10-CM

## 2020-10-28 DIAGNOSIS — Z885 Allergy status to narcotic agent status: Secondary | ICD-10-CM | POA: Diagnosis not present

## 2020-10-28 DIAGNOSIS — Z79899 Other long term (current) drug therapy: Secondary | ICD-10-CM | POA: Diagnosis not present

## 2020-10-28 DIAGNOSIS — R109 Unspecified abdominal pain: Secondary | ICD-10-CM

## 2020-10-28 DIAGNOSIS — I10 Essential (primary) hypertension: Secondary | ICD-10-CM | POA: Diagnosis present

## 2020-10-28 DIAGNOSIS — R1011 Right upper quadrant pain: Secondary | ICD-10-CM

## 2020-10-28 DIAGNOSIS — R21 Rash and other nonspecific skin eruption: Secondary | ICD-10-CM | POA: Diagnosis present

## 2020-10-28 DIAGNOSIS — K529 Noninfective gastroenteritis and colitis, unspecified: Secondary | ICD-10-CM | POA: Diagnosis not present

## 2020-10-28 DIAGNOSIS — Z20822 Contact with and (suspected) exposure to covid-19: Secondary | ICD-10-CM | POA: Diagnosis present

## 2020-10-28 DIAGNOSIS — D869 Sarcoidosis, unspecified: Secondary | ICD-10-CM

## 2020-10-28 DIAGNOSIS — Z6836 Body mass index (BMI) 36.0-36.9, adult: Secondary | ICD-10-CM | POA: Diagnosis not present

## 2020-10-28 DIAGNOSIS — G4733 Obstructive sleep apnea (adult) (pediatric): Secondary | ICD-10-CM | POA: Diagnosis present

## 2020-10-28 DIAGNOSIS — E669 Obesity, unspecified: Secondary | ICD-10-CM | POA: Diagnosis present

## 2020-10-28 LAB — LACTIC ACID, PLASMA: Lactic Acid, Venous: 1 mmol/L (ref 0.5–1.9)

## 2020-10-28 LAB — BASIC METABOLIC PANEL
Anion gap: 8 (ref 5–15)
BUN: 10 mg/dL (ref 6–20)
CO2: 26 mmol/L (ref 22–32)
Calcium: 8.9 mg/dL (ref 8.9–10.3)
Chloride: 105 mmol/L (ref 98–111)
Creatinine, Ser: 0.88 mg/dL (ref 0.44–1.00)
GFR, Estimated: >60 mL/min (ref >60)
Glucose, Bld: 113 mg/dL — ABNORMAL HIGH (ref 70–99)
Potassium: 3.9 mmol/L (ref 3.5–5.1)
Sodium: 139 mmol/L (ref 135–145)

## 2020-10-28 LAB — HEPATIC FUNCTION PANEL
ALT: 19 U/L (ref 0–44)
AST: 21 U/L (ref 15–41)
Albumin: 3 g/dL — ABNORMAL LOW (ref 3.5–5.0)
Alkaline Phosphatase: 82 U/L (ref 38–126)
Bilirubin, Direct: 0.2 mg/dL (ref 0.0–0.2)
Indirect Bilirubin: 0.6 mg/dL (ref 0.3–0.9)
Total Bilirubin: 0.8 mg/dL (ref 0.3–1.2)
Total Protein: 7.4 g/dL (ref 6.5–8.1)

## 2020-10-28 LAB — CBC WITH DIFFERENTIAL/PLATELET
Abs Immature Granulocytes: 0.04 10*3/uL (ref 0.00–0.07)
Basophils Absolute: 0 10*3/uL (ref 0.0–0.1)
Basophils Relative: 0 %
Eosinophils Absolute: 0.6 10*3/uL — ABNORMAL HIGH (ref 0.0–0.5)
Eosinophils Relative: 5 %
HCT: 36.7 % (ref 36.0–46.0)
Hemoglobin: 11.7 g/dL — ABNORMAL LOW (ref 12.0–15.0)
Immature Granulocytes: 0 %
Lymphocytes Relative: 19 %
Lymphs Abs: 2.1 10*3/uL (ref 0.7–4.0)
MCH: 29.3 pg (ref 26.0–34.0)
MCHC: 31.9 g/dL (ref 30.0–36.0)
MCV: 92 fL (ref 80.0–100.0)
Monocytes Absolute: 0.5 10*3/uL (ref 0.1–1.0)
Monocytes Relative: 4 %
Neutro Abs: 7.8 10*3/uL — ABNORMAL HIGH (ref 1.7–7.7)
Neutrophils Relative %: 72 %
Platelets: 343 10*3/uL (ref 150–400)
RBC: 3.99 MIL/uL (ref 3.87–5.11)
RDW: 13.2 % (ref 11.5–15.5)
WBC: 11 10*3/uL — ABNORMAL HIGH (ref 4.0–10.5)
nRBC: 0 % (ref 0.0–0.2)

## 2020-10-28 LAB — HIV ANTIBODY (ROUTINE TESTING W REFLEX): HIV Screen 4th Generation wRfx: NONREACTIVE

## 2020-10-28 LAB — GLUCOSE, CAPILLARY
Glucose-Capillary: 115 mg/dL — ABNORMAL HIGH (ref 70–99)
Glucose-Capillary: 128 mg/dL — ABNORMAL HIGH (ref 70–99)
Glucose-Capillary: 92 mg/dL (ref 70–99)

## 2020-10-28 MED ORDER — HYDRALAZINE HCL 20 MG/ML IJ SOLN: 10.0000 mg | INTRAMUSCULAR | Status: DC | PRN

## 2020-10-28 MED ORDER — PIPERACILLIN-TAZOBACTAM 3.375 G IVPB
3.3750 g | Freq: Three times a day (TID) | INTRAVENOUS | Status: DC
  Administered 2020-10-28 – 2020-10-29 (×4): 3.375 g via INTRAVENOUS
  Filled 2020-10-28 (×4): qty 50

## 2020-10-28 MED ORDER — AMLODIPINE BESYLATE 5 MG PO TABS
5.0000 mg | ORAL_TABLET | Freq: Every day | ORAL | Status: DC
  Administered 2020-10-28 – 2020-10-29 (×2): 5 mg via ORAL
  Filled 2020-10-28 (×2): qty 1

## 2020-10-28 MED ORDER — SACCHAROMYCES BOULARDII 250 MG PO CAPS
250.0000 mg | ORAL_CAPSULE | Freq: Two times a day (BID) | ORAL | Status: DC
  Administered 2020-10-28 – 2020-10-29 (×3): 250 mg via ORAL
  Filled 2020-10-28 (×3): qty 1

## 2020-10-28 MED ORDER — ACETAMINOPHEN 650 MG RE SUPP: 650.0000 mg | Freq: Four times a day (QID) | RECTAL | Status: DC | PRN

## 2020-10-28 MED ORDER — MORPHINE SULFATE (PF) 2 MG/ML IV SOLN: 1.0000 mg | INTRAVENOUS | Status: DC | PRN

## 2020-10-28 MED ORDER — LACTATED RINGERS IV SOLN: INTRAVENOUS | Status: AC

## 2020-10-28 MED ORDER — BENAZEPRIL HCL 20 MG PO TABS
20.0000 mg | ORAL_TABLET | Freq: Every day | ORAL | Status: DC
  Administered 2020-10-28 – 2020-10-29 (×2): 20 mg via ORAL
  Filled 2020-10-28 (×3): qty 1

## 2020-10-28 MED ORDER — ACETAMINOPHEN 325 MG PO TABS
650.0000 mg | ORAL_TABLET | Freq: Four times a day (QID) | ORAL | Status: DC | PRN
  Administered 2020-10-28: 650 mg via ORAL
  Filled 2020-10-28: qty 2

## 2020-10-28 NOTE — Progress Notes (Signed)
PROGRESS NOTE    Christina Poole  QBV:[id number] DOB: [birth year] DOA: 10/27/2020 PCP: Jarold Motto, PA   Brief Narrative: Christina Poole is a 55 y.o. female with a history of hypertension, sarcoidosis, colitis.  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  Patient started on analgesic therapy in addition to antibiotics.  General surgery consulted and plan is for conservative management.   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  General surgery was consulted on admission.  Patient was placed on empiric Zosyn.  Leukocytosis is mild.  No fevers. At this time, general surgery recommends conservative/nonoperative management -Continue Zosyn IV -Trend CBC  Primary hypertension Patient is on amlodipine, benazepril as an outpatient.  Antihypertensives held secondary to initial n.p.o. status.  Blood pressures currently uncontrolled. Resume home amlodipine and benazepril  Sarcoidosis Currently not on medication treatment. Follows with rheumatology  Obesity Body mass index is 36.47 kg/m.   DVT prophylaxis: SCDs Code Status:   Code Status: Full Code Family Communication: Husband at bedside Disposition Plan: Discharge home likely in 1 to 3 days pending general surgery recommendations, transition to oral antibiotics   Consultants:  General surgery  Procedures:  None  Antimicrobials: Zosyn IV   Subjective: Patient with continued right upper quadrant pain but it is manageable.  No nausea or vomiting.  She reports feeling like she has to have a bowel movement but when she attempts, she just passes gas.  Objective: Vitals:   10/28/20 0352 10/28/20 0556 10/28/20 0744  10/28/20 1159  BP: (!) 152/96  (!) 151/91 (!) 156/97  Pulse: 68  68 69  Resp: 14  16 18   Temp: 98.4 F (36.9 C)  98.2 F (36.8 C) 98.4 F (36.9 C)  TempSrc: Oral  Oral Oral  SpO2: 98%  98% 99%  Weight:  99.4 kg    Height: 5\' 5"  (1.651 m)       Intake/Output Summary (Last 24 hours) at 10/28/2020 1523 Last data filed at 10/28/2020 1500 Gross per 24 hour  Intake 693.09 ml  Output --  Net 693.09 ml   Filed Weights   10/27/20 1247 10/28/20 0556  Weight: 98 kg 99.4 kg    Examination:  General exam: Appears calm and comfortable Respiratory system: Clear to auscultation. Respiratory effort normal. Cardiovascular system: S1 & S2 heard, RRR. No murmurs, rubs, gallops or clicks. Gastrointestinal system: Abdomen is nondistended, soft and tender in right upper quadrant. No organomegaly or masses felt. Normal bowel sounds heard. Central nervous system: Alert and oriented. No focal neurological deficits. Musculoskeletal: No calf tenderness Skin: No cyanosis. No rashes Psychiatry: Judgement and insight appear normal. Mood & affect appropriate.     Data Reviewed: I have personally reviewed following labs and imaging studies  CBC Lab Results  Component Value Date   WBC 11.0 (H) 10/28/2020   RBC 3.99 10/28/2020   HGB 11.7 (L) 10/28/2020   HCT 36.7 10/28/2020   MCV 92.0 10/28/2020   MCH 29.3 10/28/2020   PLT 343 10/28/2020   MCHC 31.9 10/28/2020   RDW 13.2 10/28/2020   LYMPHSABS 2.1 10/28/2020   MONOABS 0.5 10/28/2020   EOSABS 0.6 (H) 10/28/2020   BASOSABS 0.0 10/28/2020  Last metabolic panel Lab Results  Component Value Date   NA 139 10/28/2020   K 3.9 10/28/2020   CL 105 10/28/2020   CO2 26 10/28/2020   BUN 10 10/28/2020   CREATININE 0.88 10/28/2020   GLUCOSE 113 (H) 10/28/2020   GFRNONAA >60 10/28/2020   GFRAA >90 02/12/2014   CALCIUM 8.9 10/28/2020   PROT 7.4 10/28/2020   ALBUMIN 3.0 (L) 10/28/2020   BILITOT 0.8 10/28/2020   ALKPHOS 82 10/28/2020   AST 21  10/28/2020   ALT 19 10/28/2020   ANIONGAP 8 10/28/2020    CBG (last 3)  Recent Labs    10/28/20 0828  GLUCAP 128*     GFR: Estimated Creatinine Clearance: 86.4 mL/min (by C-G formula based on SCr of 0.88 mg/dL).  Coagulation Profile: No results for input(s): INR, PROTIME in the last 168 hours.  Recent Results (from the past 240 hour(s))  Resp Panel by RT-PCR (Flu A&B, Covid) Nasopharyngeal Swab     Status: None   Collection Time: 10/27/20  8:53 PM   Specimen: Nasopharyngeal Swab; Nasopharyngeal(NP) swabs in vial transport medium  Result Value Ref Range Status   SARS Coronavirus 2 by RT PCR NEGATIVE NEGATIVE Final    Comment: (NOTE) SARS-CoV-2 target nucleic acids are NOT DETECTED.  The SARS-CoV-2 RNA is generally detectable in upper respiratory specimens during the acute phase of infection. The lowest concentration of SARS-CoV-2 viral copies this assay can detect is 138 copies/mL. A negative result does not preclude SARS-Cov-2 infection and should not be used as the sole basis for treatment or other patient management decisions. A negative result may occur with  improper specimen collection/handling, submission of specimen other than nasopharyngeal swab, presence of viral mutation(s) within the areas targeted by this assay, and inadequate number of viral copies(<138 copies/mL). A negative result must be combined with clinical observations, patient history, and epidemiological information. The expected result is Negative.  Fact Sheet for Patients:  BloggerCourse.com  Fact Sheet for Healthcare Providers:  SeriousBroker.it  This test is no t yet approved or cleared by the [place] FDA and  has been authorized for detection and/or diagnosis of SARS-CoV-2 by FDA under an Emergency Use Authorization (EUA). This EUA will remain  in effect (meaning this test can be used) for the duration of the COVID-19 declaration under  Section 564(b)(1) of the Act, 21 U.S.C.section 360bbb-3(b)(1), unless the authorization is terminated  or revoked sooner.       Influenza A by PCR NEGATIVE NEGATIVE Final   Influenza B by PCR NEGATIVE NEGATIVE Final    Comment: (NOTE) The Xpert Xpress SARS-CoV-2/FLU/RSV plus assay is intended as an aid in the diagnosis of influenza from Nasopharyngeal swab specimens and should not be used as a sole basis for treatment. Nasal washings and aspirates are unacceptable for Xpert Xpress SARS-CoV-2/FLU/RSV testing.  Fact Sheet for Patients: BloggerCourse.com  Fact Sheet for Healthcare Providers: SeriousBroker.it  This test is not yet approved or cleared by the [place] FDA and has been authorized for detection and/or diagnosis of SARS-CoV-2 by FDA under an Emergency Use Authorization (EUA). This EUA will remain in effect (meaning this test can be used) for the duration of the COVID-19 declaration under Section 564(b)(1) of the Act, 21 U.S.C. section 360bbb-3(b)(1), unless the authorization is terminated or revoked.  Performed at [facility], [address] [place], [place] [address]         Radiology Studies: CT Abdomen Pelvis W Contrast  Result Date: 10/27/2020 CLINICAL DATA:  55 year old female with history of suspected abdominal abscess or infection. EXAM: CT ABDOMEN AND PELVIS WITH CONTRAST TECHNIQUE: Multidetector CT imaging of the abdomen and pelvis was performed using the standard protocol following bolus administration of intravenous contrast. CONTRAST:  OMNIPAQUE IOHEXOL 350 MG/ML SOLN COMPARISON:  CT of the abdomen and pelvis 10/24/2020. FINDINGS: Lower chest: Unremarkable. Hepatobiliary: Tiny subcentimeter low-attenuation lesion in the inferior aspect of segment 6 of the liver, too small to characterize, but statistically likely represent tiny cysts. No other larger more suspicious cystic or  solid hepatic lesions. No intra or extrahepatic biliary ductal dilatation. Gallbladder is normal in appearance. Pancreas: No pancreatic mass no pancreatic ductal dilatation. No pancreatic or peripancreatic fluid collections or inflammatory changes. Spleen: Unremarkable. Adrenals/Urinary Tract: Bilateral kidneys and bilateral adrenal glands are normal in appearance. No hydroureteronephrosis. Urinary bladder is normal in appearance Stomach/Bowel: The appearance of the stomach is normal. No pathologic dilatation of small bowel or colon. Normal appendix. Vascular/Lymphatic: Mild atherosclerosis noted throughout the abdominal and pelvic vasculature. No aneurysm or dissection noted in the abdominal or pelvic vasculature. Reproductive: Uterus is enlarged and heterogeneous in appearance with multiple lesions, largest of which measures up to 7.3 cm in diameter, likely represent a large fibroid. Several of the smaller lesions are densely calcified, also likely fibroids. The ovaries are unremarkable in appearance. Other: In the right upper quadrant immediately anterior to the liver and posterior to the cecum which is displaced into the right upper quadrant there is extensive fat stranding which is similar to the prior examination, but with a small amount of fluid which is new compared to the prior examination. No pneumoperitoneum. Musculoskeletal: There are no aggressive appearing lytic or blastic lesions noted in the visualized portions of the skeleton. IMPRESSION: 1. Extensive fat stranding in the right upper quadrant of the abdomen where there is increasing surrounding inflammation and trace volume of fluid. 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 No well-defined fluid collection is noted to suggest abscess, although the increasing inflammation and trace volume of fluid may suggest developing phlegmon. 2. Normal appendix. 3. Fibroid uterus. 4. Aortic atherosclerosis. Electronically Signed    By: Trudie Reed M.D.   On: 10/27/2020 19:43        Scheduled Meds:  saccharomyces boulardii  250 mg Oral BID   Continuous Infusions:  lactated ringers 125 mL/hr at 10/28/20 0607   piperacillin-tazobactam (ZOSYN)  IV 3.375 g (10/28/20 0610)     LOS: 0 days     Jacquelin Hawking, MD [facility] 10/28/2020, 3:23 PM  If 7PM-7AM, please contact night-coverage [email or web address]

## 2020-10-28 NOTE — Plan of Care (Signed)
Pain level will remain at a 3 or less

## 2020-10-28 NOTE — Progress Notes (Signed)
Pharmacy Antibiotic Note  Christina Poole is a 55 y.o. female admitted on 10/27/2020 with  concern for intra-abdominal infection .  Pharmacy has been consulted for Zosyn dosing.  Plan: Zosyn 3.375g IV q8h (4 hour infusion).  Height: 5\' 1"  (154.9 cm) Weight: 98 kg (216 lb) IBW/kg (Calculated) : 47.8  Temp (24hrs), Avg:98.7 F (37.1 C), Min:98.4 F (36.9 C), Max:98.9 F (37.2 C)  Recent Labs  Lab 10/24/20 0142 10/27/20 1259 10/27/20 1720  WBC 11.7* 12.8*  --   CREATININE 0.80  --  0.65    Estimated Creatinine Clearance: 87.2 mL/min (by C-G formula based on SCr of 0.65 mg/dL).    Allergies  Allergen Reactions   Codeine     Severe unrelenting nausea     Thank you for allowing pharmacy to be a part of this patient's care.  12/27/20, PharmD, BCPS  10/28/2020 4:05 AM

## 2020-10-28 NOTE — ED Notes (Signed)
This RN called [facility] for Transportation for this Patient.

## 2020-10-28 NOTE — H&P (Signed)
History and Physical    Christina Elam DutchM Christina Poole:[id number]:[id number] DOB: 10/12/1965 DOA: 10/27/2020  PCP: Christina Poole  Patient coming from: Home.  Chief Complaint: Abdominal pain.  HPI: Christina Poole is a 55 y.o. female with history of hypertension, sarcoidosis, colitis presents to the ER for the second time in the last 3 days with complaint of abdominal pain.  Abdominal pain is mostly localized to the right upper quadrant.  No associated nausea vomiting fever chills or diarrhea.  Pain has been constant with no relation to food.  ED Course: In the ER patient had a repeat CT scan of the abdomen and pelvis done which shows features concerning for omental infarction.  ER physician discussed with on-call general surgeon Dr. Laurell JosephsBurke, who advised admission and antibiotics.  They will be seeing patient in consult.  Labs are significant for mild leukocytosis.  COVID test was negative.  Review of Systems: As per HPI, rest all negative.   Past Medical History:  Diagnosis Date   Colitis    Hypertension    Pericarditis 2007   Sarcoidosis     Past Surgical History:  Procedure Laterality Date   APPENDECTOMY     CESAREAN SECTION     MYOMECTOMY       reports that she has never smoked. She has never used smokeless tobacco. She reports that she does not drink alcohol and does not use drugs.  Allergies  Allergen Reactions   Codeine     Severe unrelenting nausea    Family History  Problem Relation Age of Onset   Colon cancer Father        in his 4360s   Diabetes Mother    Hypercholesterolemia Mother     Prior to Admission medications   Medication Sig Start Date End Date Taking? Authorizing Provider  amLODipine-benazepril (LOTREL) 5-20 MG capsule TAKE 1 CAPSULE BY MOUTH EVERY DAY 10/18/20  Yes Christina Poole  cholecalciferol (VITAMIN D) 1000 units tablet Take 2,000 Units by mouth daily.   Yes [provider]  HYDROcodone-acetaminophen (NORCO/VICODIN) 5-325 MG tablet Take 1  tablet by mouth every 6 (six) hours as needed. 10/24/20  Yes Horton, Mayer Maskerourtney F, MD  Multiple Vitamins-Minerals (ONE DAILY CALCIUM/IRON) TABS Take by mouth.   Yes [provider]  ondansetron (ZOFRAN ODT) 4 MG disintegrating tablet Take 1 tablet (4 mg total) by mouth every 8 (eight) hours as needed for nausea or vomiting. 10/24/20  Yes Horton, Mayer Maskerourtney F, MD    Physical Exam: Constitutional: Moderately built and nourished. Vitals:   10/28/20 0000 10/28/20 0100 10/28/20 0200 10/28/20 0352  BP: (!) 153/97 (!) 132/92 (!) 146/94 (!) 152/96  Pulse: 75 70 68 68  Resp:    14  Temp:      SpO2: 98% 95% 99% 98%  Weight:      Height:       Eyes: Anicteric no pallor. ENMT: No discharge from the ears eyes nose and mouth. Neck: No mass felt.  No neck rigidity. Respiratory: No rhonchi or crepitations. Cardiovascular: S1-S2 heard. Abdomen: Right upper quadrant tenderness no guarding or rigidity. Musculoskeletal: No edema. Skin: Rash in the both lower extremities. Neurologic: Alert awake oriented time place and person.  Moves all extremities. Psychiatric: Appears normal.  Normal affect.   Labs on Admission: I have personally reviewed following labs and imaging studies  CBC: Recent Labs  Lab 10/24/20 0142 10/27/20 1259  WBC 11.7* 12.8*  NEUTROABS 7.8*  --   HGB 12.6 12.7  HCT  38.9 38.7  MCV 91.5 89.8  PLT 262 332   Basic Metabolic Panel: Recent Labs  Lab 10/24/20 0142 10/27/20 1720  NA 138 140  K 3.6 3.8  CL 105 108  CO2 24 23  GLUCOSE 110* 91  BUN 13 16  CREATININE 0.80 0.65  CALCIUM 9.5 8.7*   GFR: Estimated Creatinine Clearance: 87.2 mL/min (by C-G formula based on SCr of 0.65 mg/dL). Liver Function Tests: Recent Labs  Lab 10/24/20 0142 10/27/20 1720  AST 14* 19  ALT 10 15  ALKPHOS 69 74  BILITOT 0.7 0.4  PROT 7.8 7.3  ALBUMIN 4.0 3.5   Recent Labs  Lab 10/24/20 0142 10/27/20 1720  LIPASE 40 17   No results for input(s): AMMONIA in the last 168  hours. Coagulation Profile: No results for input(s): INR, PROTIME in the last 168 hours. Cardiac Enzymes: No results for input(s): CKTOTAL, CKMB, CKMBINDEX, TROPONINI in the last 168 hours. BNP (last 3 results) No results for input(s): PROBNP in the last 8760 hours. HbA1C: No results for input(s): HGBA1C in the last 72 hours. CBG: No results for input(s): GLUCAP in the last 168 hours. Lipid Profile: No results for input(s): CHOL, HDL, LDLCALC, TRIG, CHOLHDL, LDLDIRECT in the last 72 hours. Thyroid Function Tests: No results for input(s): TSH, T4TOTAL, FREET4, T3FREE, THYROIDAB in the last 72 hours. Anemia Panel: No results for input(s): VITAMINB12, FOLATE, FERRITIN, TIBC, IRON, RETICCTPCT in the last 72 hours. Urine analysis:    Component Value Date/Time   COLORURINE YELLOW 10/27/2020 1901   APPEARANCEUR CLEAR 10/27/2020 1901   LABSPEC >1.046 (H) 10/27/2020 1901   PHURINE 6.0 10/27/2020 1901   GLUCOSEU NEGATIVE 10/27/2020 1901   HGBUR NEGATIVE 10/27/2020 1901   BILIRUBINUR NEGATIVE 10/27/2020 1901   KETONESUR NEGATIVE 10/27/2020 1901   PROTEINUR 30 (A) 10/27/2020 1901   UROBILINOGEN 0.2 02/12/2014 1704   NITRITE NEGATIVE 10/27/2020 1901   LEUKOCYTESUR NEGATIVE 10/27/2020 1901   Sepsis Labs: @LABRCNTIP (procalcitonin:4,lacticidven:4) ) Recent Results (from the past 240 hour(s))  Resp Panel by RT-PCR (Flu A&B, Covid) Nasopharyngeal Swab     Status: None   Collection Time: 10/27/20  8:53 PM   Specimen: Nasopharyngeal Swab; Nasopharyngeal(NP) swabs in vial transport medium  Result Value Ref Range Status   SARS Coronavirus 2 by RT PCR NEGATIVE NEGATIVE Final    Comment: (NOTE) SARS-CoV-2 target nucleic acids are NOT DETECTED.  The SARS-CoV-2 RNA is generally detectable in upper respiratory specimens during the acute phase of infection. The lowest concentration of SARS-CoV-2 viral copies this assay can detect is 138 copies/mL. A negative result does not preclude  SARS-Cov-2 infection and should not be used as the sole basis for treatment or other patient management decisions. A negative result may occur with  improper specimen collection/handling, submission of specimen other than nasopharyngeal swab, presence of viral mutation(s) within the areas targeted by this assay, and inadequate number of viral copies(<138 copies/mL). A negative result must be combined with clinical observations, patient history, and epidemiological information. The expected result is Negative.  Fact Sheet for Patients:  12/27/20  Fact Sheet for Healthcare Providers:  BloggerCourse.com  This test is no t yet approved or cleared by the [place] FDA and  has been authorized for detection and/or diagnosis of SARS-CoV-2 by FDA under an Emergency Use Authorization (EUA). This EUA will remain  in effect (meaning this test can be used) for the duration of the COVID-19 declaration under Section 564(b)(1) of the Act, 21 U.S.C.section 360bbb-3(b)(1), unless the authorization is  terminated  or revoked sooner.       Influenza A by PCR NEGATIVE NEGATIVE Final   Influenza B by PCR NEGATIVE NEGATIVE Final    Comment: (NOTE) The Xpert Xpress SARS-CoV-2/FLU/RSV plus assay is intended as an aid in the diagnosis of influenza from Nasopharyngeal swab specimens and should not be used as a sole basis for treatment. Nasal washings and aspirates are unacceptable for Xpert Xpress SARS-CoV-2/FLU/RSV testing.  Fact Sheet for Patients: BloggerCourse.com  Fact Sheet for Healthcare Providers: SeriousBroker.it  This test is not yet approved or cleared by the [place] FDA and has been authorized for detection and/or diagnosis of SARS-CoV-2 by FDA under an Emergency Use Authorization (EUA). This EUA will remain in effect (meaning this test can be used) for the duration of  the COVID-19 declaration under Section 564(b)(1) of the Act, 21 U.S.C. section 360bbb-3(b)(1), unless the authorization is terminated or revoked.  Performed at [facility], [address] [place], [place] [address]      Radiological Exams on Admission: CT Abdomen Pelvis W Contrast  Result Date: 10/27/2020 CLINICAL DATA:  55 year old female with history of suspected abdominal abscess or infection. EXAM: CT ABDOMEN AND PELVIS WITH CONTRAST TECHNIQUE: Multidetector CT imaging of the abdomen and pelvis was performed using the standard protocol following bolus administration of intravenous contrast. CONTRAST:  OMNIPAQUE IOHEXOL 350 MG/ML SOLN COMPARISON:  CT of the abdomen and pelvis 10/24/2020. FINDINGS: Lower chest: Unremarkable. Hepatobiliary: Tiny subcentimeter low-attenuation lesion in the inferior aspect of segment 6 of the liver, too small to characterize, but statistically likely represent tiny cysts. No other larger more suspicious cystic or solid hepatic lesions. No intra or extrahepatic biliary ductal dilatation. Gallbladder is normal in appearance. Pancreas: No pancreatic mass no pancreatic ductal dilatation. No pancreatic or peripancreatic fluid collections or inflammatory changes. Spleen: Unremarkable. Adrenals/Urinary Tract: Bilateral kidneys and bilateral adrenal glands are normal in appearance. No hydroureteronephrosis. Urinary bladder is normal in appearance Stomach/Bowel: The appearance of the stomach is normal. No pathologic dilatation of small bowel or colon. Normal appendix. Vascular/Lymphatic: Mild atherosclerosis noted throughout the abdominal and pelvic vasculature. No aneurysm or dissection noted in the abdominal or pelvic vasculature. Reproductive: Uterus is enlarged and heterogeneous in appearance with multiple lesions, largest of which measures up to 7.3 cm in diameter, likely represent a large fibroid. Several of the smaller lesions are densely  calcified, also likely fibroids. The ovaries are unremarkable in appearance. Other: In the right upper quadrant immediately anterior to the liver and posterior to the cecum which is displaced into the right upper quadrant there is extensive fat stranding which is similar to the prior examination, but with a small amount of fluid which is new compared to the prior examination. No pneumoperitoneum. Musculoskeletal: There are no aggressive appearing lytic or blastic lesions noted in the visualized portions of the skeleton. IMPRESSION: 1. Extensive fat stranding in the right upper quadrant of the abdomen where there is increasing surrounding inflammation and trace volume of fluid. 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 No well-defined fluid collection is noted to suggest abscess, although the increasing inflammation and trace volume of fluid may suggest developing phlegmon. 2. Normal appendix. 3. Fibroid uterus. 4. Aortic atherosclerosis. Electronically Signed   By: Trudie Reed M.D.   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  On-call general surgeon Dr. Grayland Ormond was consulted.  At this time patient is placed on IV fluids antibiotics pain relief medication and keep patient n.p.o. until surgical surgery sees patient. Hypertension we will keep patient n.p.o. and IV hydralazine since patient is n.p.o. History of sarcoidosis being followed by Dr. Lendon Colonel rheumatologist.  Presently not on any medication.  Patient has skin rash on the lower extremities for which patient has appointment scheduled for tomorrow with rheumatologist. History of colitis used to follow with Dr. Dulce Sellar gastroenterologist used to be on Remicade.  Has not been on any medications for more than 3  years now.  Since patient has persistent pain with features concerning for omental infarction will need close monitoring and inpatient status.   DVT prophylaxis: SCDs.  Avoiding anticoagulation if in case patient needs surgery. Code Status: Full code. Family Communication: Discussed with patient. Disposition Plan: Home. Consults called: General surgery. Admission status: Inpatient.   Eduard Clos MD [facility] Pager [phone].  If 7PM-7AM, please contact night-coverage [email or web address] Password [email or web address]  10/28/2020, 4:03 AM

## 2020-10-28 NOTE — Consult Note (Signed)
Reason for Consult:omental infarct Referring Physician: Arvilla Market  Christina Poole is an 55 y.o. female.  HPI: 55 year old female with history of hypertension and sarcoidosis complains of abdominal pain for the past 4 days.  She was initially seen on 8/29.  CT A/P at that time showed some inflammatory changes in the right upper quadrant mesentery.  Right upper quadrant ultrasound was unremarkable.  She was discharged but continued to have pain.  She returned to [facility] for further evaluation.  Repeat CT scan of the abdomen pelvis was performed.  This shows evidence of an area of omental infarction in the right upper quadrant.  She was accepted in transfer for admission by the hospitalist service.  I was asked to see her for surgical management.  She reports the pain has been pretty persistent for the past several days.  She has no associated nausea and vomiting.  She has not had a bowel movement since this issue started.  Past Medical History:  Diagnosis Date   Colitis    Hypertension    Pericarditis 2007   Sarcoidosis     Past Surgical History:  Procedure Laterality Date   APPENDECTOMY     CESAREAN SECTION     MYOMECTOMY      Family History  Problem Relation Age of Onset   Colon cancer Father        in his 53s   Diabetes Mother    Hypercholesterolemia Mother     Social History:  reports that she has never smoked. She has never used smokeless tobacco. She reports that she does not drink alcohol and does not use drugs.  Allergies:  Allergies  Allergen Reactions   Codeine     Severe unrelenting nausea    Medications: I have reviewed the patient's current medications.  Results for orders placed or performed during the hospital encounter of 10/27/20 (from the past 48 hour(s))  CBC     Status: Abnormal   Collection Time: 10/27/20 12:59 PM  Result Value Ref Range   WBC 12.8 (H) 4.0 - 10.5 K/uL   RBC 4.31 3.87 - 5.11 MIL/uL   Hemoglobin 12.7 12.0 - 15.0 g/dL    HCT 53.2 99.2 - 42.6 %   MCV 89.8 80.0 - 100.0 fL   MCH 29.5 26.0 - 34.0 pg   MCHC 32.8 30.0 - 36.0 g/dL   RDW 83.4 19.6 - 22.2 %   Platelets 332 150 - 400 K/uL   nRBC 0.0 0.0 - 0.2 %    Comment: Performed at [facility], [address], [place], [place] [address]  Comprehensive metabolic panel     Status: Abnormal   Collection Time: 10/27/20  5:20 PM  Result Value Ref Range   Sodium 140 135 - 145 mmol/L   Potassium 3.8 3.5 - 5.1 mmol/L   Chloride 108 98 - 111 mmol/L   CO2 23 22 - 32 mmol/L   Glucose, Bld 91 70 - 99 mg/dL    Comment: Glucose reference range applies only to samples taken after fasting for at least 8 hours.   BUN 16 6 - 20 mg/dL   Creatinine, Ser 2.11 0.44 - 1.00 mg/dL   Calcium 8.7 (L) 8.9 - 10.3 mg/dL   Total Protein 7.3 6.5 - 8.1 g/dL   Albumin 3.5 3.5 - 5.0 g/dL   AST 19 15 - 41 U/L   ALT 15 0 - 44 U/L   Alkaline Phosphatase 74 38 - 126 U/L   Total  Bilirubin 0.4 0.3 - 1.2 mg/dL   GFR, Estimated >29 >93 mL/min    Comment: (NOTE) Calculated using the CKD-EPI Creatinine Equation (2021)    Anion gap 9 5 - 15    Comment: Performed at [facility], [address], [place], [place] [address]  Lipase, blood     Status: None   Collection Time: 10/27/20  5:20 PM  Result Value Ref Range   Lipase 17 11 - 51 U/L    Comment: Performed at [facility], [address] [place], [place] [address]  Urinalysis, Routine w reflex microscopic Urine, Clean Catch     Status: Abnormal   Collection Time: 10/27/20  7:01 PM  Result Value Ref Range   Color, Urine YELLOW YELLOW   APPearance CLEAR CLEAR   Specific Gravity, Urine >1.046 (H) 1.005 - 1.030   pH 6.0 5.0 - 8.0   Glucose, UA NEGATIVE NEGATIVE mg/dL   Hgb urine dipstick NEGATIVE NEGATIVE   Bilirubin Urine NEGATIVE NEGATIVE   Ketones, ur NEGATIVE NEGATIVE mg/dL   Protein, ur 30 (A) NEGATIVE mg/dL   Nitrite NEGATIVE NEGATIVE   Leukocytes,Ua NEGATIVE NEGATIVE    RBC / HPF 0-5 0 - 5 RBC/hpf   WBC, UA 6-10 0 - 5 WBC/hpf   Bacteria, UA RARE (A) NONE SEEN   Squamous Epithelial / LPF 6-10 0 - 5   Mucus PRESENT     Comment: Performed at [facility], [address] [place], [place] [address]  Pregnancy, urine     Status: None   Collection Time: 10/27/20  7:01 PM  Result Value Ref Range   Preg Test, Ur NEGATIVE NEGATIVE    Comment:        THE SENSITIVITY OF THIS METHODOLOGY IS >20 mIU/mL. Performed at [facility], [address], [place], [place] [address]   Resp Panel by RT-PCR (Flu A&B, Covid) Nasopharyngeal Swab     Status: None   Collection Time: 10/27/20  8:53 PM   Specimen: Nasopharyngeal Swab; Nasopharyngeal(NP) swabs in vial transport medium  Result Value Ref Range   SARS Coronavirus 2 by RT PCR NEGATIVE NEGATIVE    Comment: (NOTE) SARS-CoV-2 target nucleic acids are NOT DETECTED.  The SARS-CoV-2 RNA is generally detectable in upper respiratory specimens during the acute phase of infection. The lowest concentration of SARS-CoV-2 viral copies this assay can detect is 138 copies/mL. A negative result does not preclude SARS-Cov-2 infection and should not be used as the sole basis for treatment or other patient management decisions. A negative result may occur with  improper specimen collection/handling, submission of specimen other than nasopharyngeal swab, presence of viral mutation(s) within the areas targeted by this assay, and inadequate number of viral copies(<138 copies/mL). A negative result must be combined with clinical observations, patient history, and epidemiological information. The expected result is Negative.  Fact Sheet for Patients:  BloggerCourse.com  Fact Sheet for Healthcare Providers:  SeriousBroker.it  This test is no t yet approved or cleared by the [place] FDA and  has been authorized for detection and/or  diagnosis of SARS-CoV-2 by FDA under an Emergency Use Authorization (EUA). This EUA will remain  in effect (meaning this test can be used) for the duration of the COVID-19 declaration under Section 564(b)(1) of the Act, 21 U.S.C.section 360bbb-3(b)(1), unless the authorization is terminated  or revoked sooner.       Influenza A by PCR NEGATIVE NEGATIVE   Influenza B by PCR NEGATIVE NEGATIVE    Comment: (  NOTE) The Xpert Xpress SARS-CoV-2/FLU/RSV plus assay is intended as an aid in the diagnosis of influenza from Nasopharyngeal swab specimens and should not be used as a sole basis for treatment. Nasal washings and aspirates are unacceptable for Xpert Xpress SARS-CoV-2/FLU/RSV testing.  Fact Sheet for Patients: BloggerCourse.comhttps://www.fda.gov/media/152166/download  Fact Sheet for Healthcare Providers: SeriousBroker.ithttps://www.fda.gov/media/152162/download  This test is not yet approved or cleared by the [place] FDA and has been authorized for detection and/or diagnosis of SARS-CoV-2 by FDA under an Emergency Use Authorization (EUA). This EUA will remain in effect (meaning this test can be used) for the duration of the COVID-19 declaration under Section 564(b)(1) of the Act, 21 U.S.C. section 360bbb-3(b)(1), unless the authorization is terminated or revoked.  Performed at [facility], [address], [place], [place] [id number]     CT Abdomen Pelvis W Contrast  Result Date: 10/27/2020 CLINICAL DATA:  55 year old female with history of suspected abdominal abscess or infection. EXAM: CT ABDOMEN AND PELVIS WITH CONTRAST TECHNIQUE: Multidetector CT imaging of the abdomen and pelvis was performed using the standard protocol following bolus administration of intravenous contrast. CONTRAST:  100mL OMNIPAQUE IOHEXOL 350 MG/ML SOLN COMPARISON:  CT of the abdomen and pelvis 10/24/2020. FINDINGS: Lower chest: Unremarkable. Hepatobiliary: Tiny subcentimeter low-attenuation lesion in the  inferior aspect of segment 6 of the liver, too small to characterize, but statistically likely represent tiny cysts. No other larger more suspicious cystic or solid hepatic lesions. No intra or extrahepatic biliary ductal dilatation. Gallbladder is normal in appearance. Pancreas: No pancreatic mass no pancreatic ductal dilatation. No pancreatic or peripancreatic fluid collections or inflammatory changes. Spleen: Unremarkable. Adrenals/Urinary Tract: Bilateral kidneys and bilateral adrenal glands are normal in appearance. No hydroureteronephrosis. Urinary bladder is normal in appearance Stomach/Bowel: The appearance of the stomach is normal. No pathologic dilatation of small bowel or colon. Normal appendix. Vascular/Lymphatic: Mild atherosclerosis noted throughout the abdominal and pelvic vasculature. No aneurysm or dissection noted in the abdominal or pelvic vasculature. Reproductive: Uterus is enlarged and heterogeneous in appearance with multiple lesions, largest of which measures up to 7.3 cm in diameter, likely represent a large fibroid. Several of the smaller lesions are densely calcified, also likely fibroids. The ovaries are unremarkable in appearance. Other: In the right upper quadrant immediately anterior to the liver and posterior to the cecum which is displaced into the right upper quadrant there is extensive fat stranding which is similar to the prior examination, but with a small amount of fluid which is new compared to the prior examination. No pneumoperitoneum. Musculoskeletal: There are no aggressive appearing lytic or blastic lesions noted in the visualized portions of the skeleton. IMPRESSION: 1. Extensive fat stranding in the right upper quadrant of the abdomen where there is increasing surrounding inflammation and trace volume of fluid. 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 No well-defined fluid collection is noted to suggest abscess, although the increasing  inflammation and trace volume of fluid may suggest developing phlegmon. 2. Normal appendix. 3. Fibroid uterus. 4. Aortic atherosclerosis. Electronically Signed   By: Trudie Reedaniel  Entrikin M.D.   On: 10/27/2020 19:43    Review of Systems  Constitutional: Negative.   HENT: Negative.    Eyes: Negative.   Respiratory: Negative.    Cardiovascular: Negative.   Gastrointestinal:  Positive for abdominal pain and constipation. Negative for diarrhea, nausea and vomiting.  Endocrine: Negative.   Genitourinary: Negative.   Musculoskeletal: Negative.   Allergic/Immunologic: Negative.   Neurological: Negative.   Hematological: Negative.  Psychiatric/Behavioral: Negative.    Blood pressure (!) 152/96, pulse 68, temperature 98.4 F (36.9 C), temperature source Oral, resp. rate 14, height 5\' 5"  (1.651 m), weight 99.4 kg, SpO2 98 %. Physical Exam Constitutional:      Appearance: She is well-developed. She is not ill-appearing.  HENT:     Head: Normocephalic.  Eyes:     Extraocular Movements: Extraocular movements intact.     Pupils: Pupils are equal, round, and reactive to light.  Cardiovascular:     Rate and Rhythm: Normal rate and regular rhythm.     Heart sounds: Normal heart sounds.  Pulmonary:     Effort: Pulmonary effort is normal.     Breath sounds: Normal breath sounds.  Abdominal:     General: Abdomen is flat. Bowel sounds are normal. There is no distension.     Palpations: Abdomen is soft.     Tenderness: There is abdominal tenderness in the right upper quadrant. There is no guarding or rebound.     Hernia: No hernia is present.  Skin:    General: Skin is warm and dry.     Capillary Refill: Capillary refill takes less than 2 seconds.  Neurological:     Mental Status: She is alert and oriented to person, place, and time.  Psychiatric:        Mood and Affect: Mood normal.    Assessment/Plan: Omental infarction -agree with medical admission and IV antibiotics.  Clear liquids.  She  requested a probiotic in light of the antibiotics and I ordered that.  She should improve with this therapy.  If she does not, she may require surgery to remove this portion of omentum.  I discussed this in detail with her and answered her questions.  10/28/2020, 6:23 AM

## 2020-10-29 DIAGNOSIS — K529 Noninfective gastroenteritis and colitis, unspecified: Secondary | ICD-10-CM

## 2020-10-29 LAB — CBC
HCT: 35.9 % — ABNORMAL LOW (ref 36.0–46.0)
Hemoglobin: 11.7 g/dL — ABNORMAL LOW (ref 12.0–15.0)
MCH: 29.8 pg (ref 26.0–34.0)
MCHC: 32.6 g/dL (ref 30.0–36.0)
MCV: 91.6 fL (ref 80.0–100.0)
Platelets: 325 10*3/uL (ref 150–400)
RBC: 3.92 MIL/uL (ref 3.87–5.11)
RDW: 13.1 % (ref 11.5–15.5)
WBC: 10.7 10*3/uL — ABNORMAL HIGH (ref 4.0–10.5)
nRBC: 0 % (ref 0.0–0.2)

## 2020-10-29 MED ORDER — AMOXICILLIN-POT CLAVULANATE ER 1000-62.5 MG PO TB12: 1.0000 | ORAL_TABLET | Freq: Two times a day (BID) | ORAL | 0 refills | Status: AC

## 2020-10-29 NOTE — Progress Notes (Signed)
Patient ID: Christina Poole, female   DOB: 1965-09-20, 55 y.o.   MRN: [id number] [facility] Surgery Progress Note:   * No surgery found *  Subjective: Mental status is clear.  Complaints right upper quadrant pain. Objective: Vital signs in last 24 hours: Temp:  [98.3 F (36.8 C)-98.7 F (37.1 C)] 98.5 F (36.9 C) (09/03 0751) Pulse Rate:  [69-86] 73 (09/03 0751) Resp:  [16-18] 18 (09/03 0751) BP: (153-172)/(91-104) 155/104 (09/03 0751) SpO2:  [96 %-100 %] 96 % (09/03 0751)  Intake/Output from previous day: 09/02 0701 - 09/03 0700 In: 643.1 [I.V.:593.1; IV Piggyback:50] Out: -  Intake/Output this shift: No intake/output data recorded.  Physical Exam: Work of breathing is normal.  Still with right upper quadrant pain  Lab Results:  Results for orders placed or performed during the hospital encounter of 10/27/20 (from the past 48 hour(s))  CBC     Status: Abnormal   Collection Time: 10/27/20 12:59 PM  Result Value Ref Range   WBC 12.8 (H) 4.0 - 10.5 K/uL   RBC 4.31 3.87 - 5.11 MIL/uL   Hemoglobin 12.7 12.0 - 15.0 g/dL   HCT 36.1 44.3 - 15.4 %   MCV 89.8 80.0 - 100.0 fL   MCH 29.5 26.0 - 34.0 pg   MCHC 32.8 30.0 - 36.0 g/dL   RDW 00.8 67.6 - 19.5 %   Platelets 332 150 - 400 K/uL   nRBC 0.0 0.0 - 0.2 %    Comment: Performed at [facility], [address], [place], [place] [address]  Comprehensive metabolic panel     Status: Abnormal   Collection Time: 10/27/20  5:20 PM  Result Value Ref Range   Sodium 140 135 - 145 mmol/L   Potassium 3.8 3.5 - 5.1 mmol/L   Chloride 108 98 - 111 mmol/L   CO2 23 22 - 32 mmol/L   Glucose, Bld 91 70 - 99 mg/dL    Comment: Glucose reference range applies only to samples taken after fasting for at least 8 hours.   BUN 16 6 - 20 mg/dL   Creatinine, Ser 7.12 0.44 - 1.00 mg/dL   Calcium 8.7 (L) 8.9 - 10.3 mg/dL   Total Protein 7.3 6.5 - 8.1 g/dL   Albumin 3.5 3.5 - 5.0 g/dL   AST 19 15 - 41 U/L   ALT 15 0 - 44 U/L    Alkaline Phosphatase 74 38 - 126 U/L   Total Bilirubin 0.4 0.3 - 1.2 mg/dL   GFR, Estimated >45 >80 mL/min    Comment: (NOTE) Calculated using the CKD-EPI Creatinine Equation (2021)    Anion gap 9 5 - 15    Comment: Performed at [facility], [address] [place], [place] [address]  Lipase, blood     Status: None   Collection Time: 10/27/20  5:20 PM  Result Value Ref Range   Lipase 17 11 - 51 U/L    Comment: Performed at [facility], [address], [place], [place] [address]  Urinalysis, Routine w reflex microscopic Urine, Clean Catch     Status: Abnormal   Collection Time: 10/27/20  7:01 PM  Result Value Ref Range   Color, Urine YELLOW YELLOW   APPearance CLEAR CLEAR   Specific Gravity, Urine >1.046 (H) 1.005 - 1.030   pH 6.0 5.0 - 8.0   Glucose, UA NEGATIVE NEGATIVE mg/dL   Hgb urine dipstick NEGATIVE NEGATIVE   Bilirubin Urine NEGATIVE NEGATIVE   Ketones, ur NEGATIVE NEGATIVE  mg/dL   Protein, ur 30 (A) NEGATIVE mg/dL   Nitrite NEGATIVE NEGATIVE   Leukocytes,Ua NEGATIVE NEGATIVE   RBC / HPF 0-5 0 - 5 RBC/hpf   WBC, UA 6-10 0 - 5 WBC/hpf   Bacteria, UA RARE (A) NONE SEEN   Squamous Epithelial / LPF 6-10 0 - 5   Mucus PRESENT     Comment: Performed at [facility], [address], [place], [place] [address]  Pregnancy, urine     Status: None   Collection Time: 10/27/20  7:01 PM  Result Value Ref Range   Preg Test, Ur NEGATIVE NEGATIVE    Comment:        THE SENSITIVITY OF THIS METHODOLOGY IS >20 mIU/mL. Performed at [facility], [address] [place], [place] [address]   Resp Panel by RT-PCR (Flu A&B, Covid) Nasopharyngeal Swab     Status: None   Collection Time: 10/27/20  8:53 PM   Specimen: Nasopharyngeal Swab; Nasopharyngeal(NP) swabs in vial transport medium  Result Value Ref Range   SARS Coronavirus 2 by RT PCR NEGATIVE NEGATIVE    Comment: (NOTE) SARS-CoV-2 target nucleic  acids are NOT DETECTED.  The SARS-CoV-2 RNA is generally detectable in upper respiratory specimens during the acute phase of infection. The lowest concentration of SARS-CoV-2 viral copies this assay can detect is 138 copies/mL. A negative result does not preclude SARS-Cov-2 infection and should not be used as the sole basis for treatment or other patient management decisions. A negative result may occur with  improper specimen collection/handling, submission of specimen other than nasopharyngeal swab, presence of viral mutation(s) within the areas targeted by this assay, and inadequate number of viral copies(<138 copies/mL). A negative result must be combined with clinical observations, patient history, and epidemiological information. The expected result is Negative.  Fact Sheet for Patients:  BloggerCourse.com  Fact Sheet for Healthcare Providers:  SeriousBroker.it  This test is no t yet approved or cleared by the [place] FDA and  has been authorized for detection and/or diagnosis of SARS-CoV-2 by FDA under an Emergency Use Authorization (EUA). This EUA will remain  in effect (meaning this test can be used) for the duration of the COVID-19 declaration under Section 564(b)(1) of the Act, 21 U.S.C.section 360bbb-3(b)(1), unless the authorization is terminated  or revoked sooner.       Influenza A by PCR NEGATIVE NEGATIVE   Influenza B by PCR NEGATIVE NEGATIVE    Comment: (NOTE) The Xpert Xpress SARS-CoV-2/FLU/RSV plus assay is intended as an aid in the diagnosis of influenza from Nasopharyngeal swab specimens and should not be used as a sole basis for treatment. Nasal washings and aspirates are unacceptable for Xpert Xpress SARS-CoV-2/FLU/RSV testing.  Fact Sheet for Patients: BloggerCourse.com  Fact Sheet for Healthcare Providers: SeriousBroker.it  This test is not  yet approved or cleared by the [place] FDA and has been authorized for detection and/or diagnosis of SARS-CoV-2 by FDA under an Emergency Use Authorization (EUA). This EUA will remain in effect (meaning this test can be used) for the duration of the COVID-19 declaration under Section 564(b)(1) of the Act, 21 U.S.C. section 360bbb-3(b)(1), unless the authorization is terminated or revoked.  Performed at [facility], [address], [place], [place] [address]   Glucose, capillary     Status: Abnormal   Collection Time: 10/28/20  8:28 AM  Result Value Ref Range   Glucose-Capillary 128 (H) 70 - 99 mg/dL    Comment: Glucose reference range applies only  to samples taken after fasting for at least 8 hours.  HIV Antibody (routine testing w rflx)     Status: None   Collection Time: 10/28/20 12:37 PM  Result Value Ref Range   HIV Screen 4th Generation wRfx Non Reactive Non Reactive    Comment: Performed at [facility], [address] [place], [place] [address]  Hepatic function panel     Status: Abnormal   Collection Time: 10/28/20 12:37 PM  Result Value Ref Range   Total Protein 7.4 6.5 - 8.1 g/dL   Albumin 3.0 (L) 3.5 - 5.0 g/dL   AST 21 15 - 41 U/L   ALT 19 0 - 44 U/L   Alkaline Phosphatase 82 38 - 126 U/L   Total Bilirubin 0.8 0.3 - 1.2 mg/dL   Bilirubin, Direct 0.2 0.0 - 0.2 mg/dL   Indirect Bilirubin 0.6 0.3 - 0.9 mg/dL    Comment: Performed at [facility], [address] [place], [place] [address]  Basic metabolic panel     Status: Abnormal   Collection Time: 10/28/20 12:37 PM  Result Value Ref Range   Sodium 139 135 - 145 mmol/L   Potassium 3.9 3.5 - 5.1 mmol/L   Chloride 105 98 - 111 mmol/L   CO2 26 22 - 32 mmol/L   Glucose, Bld 113 (H) 70 - 99 mg/dL    Comment: Glucose reference range applies only to samples taken after fasting for at least 8 hours.   BUN 10 6 - 20 mg/dL   Creatinine, Ser 1.320.88 0.44 - 1.00 mg/dL   Calcium 8.9 8.9 -  44.010.3 mg/dL   GFR, Estimated >10>60 >27>60 mL/min    Comment: (NOTE) Calculated using the CKD-EPI Creatinine Equation (2021)    Anion gap 8 5 - 15    Comment: Performed at [facility], [address] [place], [place] [address]  CBC WITH DIFFERENTIAL     Status: Abnormal   Collection Time: 10/28/20 12:37 PM  Result Value Ref Range   WBC 11.0 (H) 4.0 - 10.5 K/uL   RBC 3.99 3.87 - 5.11 MIL/uL   Hemoglobin 11.7 (L) 12.0 - 15.0 g/dL   HCT 44.036.7 34.736.0 - 42.546.0 %   MCV 92.0 80.0 - 100.0 fL   MCH 29.3 26.0 - 34.0 pg   MCHC 31.9 30.0 - 36.0 g/dL   RDW 95.613.2 38.711.5 - 56.415.5 %   Platelets 343 150 - 400 K/uL   nRBC 0.0 0.0 - 0.2 %   Neutrophils Relative % 72 %   Neutro Abs 7.8 (H) 1.7 - 7.7 K/uL   Lymphocytes Relative 19 %   Lymphs Abs 2.1 0.7 - 4.0 K/uL   Monocytes Relative 4 %   Monocytes Absolute 0.5 0.1 - 1.0 K/uL   Eosinophils Relative 5 %   Eosinophils Absolute 0.6 (H) 0.0 - 0.5 K/uL   Basophils Relative 0 %   Basophils Absolute 0.0 0.0 - 0.1 K/uL   Immature Granulocytes 0 %   Abs Immature Granulocytes 0.04 0.00 - 0.07 K/uL    Comment: Performed at [facility], [address] [place], [place] [address]  Lactic acid, plasma     Status: None   Collection Time: 10/28/20 12:37 PM  Result Value Ref Range   Lactic Acid, Venous 1.0 0.5 - 1.9 mmol/L    Comment: Performed at [facility], [address] [place], [place] [address]  Glucose, capillary     Status: None  Collection Time: 10/28/20  4:20 PM  Result Value Ref Range   Glucose-Capillary 92 70 - 99 mg/dL    Comment: Glucose reference range applies only to samples taken after fasting for at least 8 hours.  Glucose, capillary     Status: Abnormal   Collection Time: 10/28/20 11:42 PM  Result Value Ref Range   Glucose-Capillary 115 (H) 70 - 99 mg/dL    Comment: Glucose reference range applies only to samples taken after fasting for at least 8 hours.  CBC     Status: Abnormal   Collection Time: 10/29/20 12:57 AM  Result  Value Ref Range   WBC 10.7 (H) 4.0 - 10.5 K/uL   RBC 3.92 3.87 - 5.11 MIL/uL   Hemoglobin 11.7 (L) 12.0 - 15.0 g/dL   HCT 31.5 (L) 40.0 - 86.7 %   MCV 91.6 80.0 - 100.0 fL   MCH 29.8 26.0 - 34.0 pg   MCHC 32.6 30.0 - 36.0 g/dL   RDW 61.9 50.9 - 32.6 %   Platelets 325 150 - 400 K/uL   nRBC 0.0 0.0 - 0.2 %    Comment: Performed at [facility], [address], [place], [place] [address]    Radiology/Results: CT Abdomen Pelvis W Contrast  Result Date: 10/27/2020 CLINICAL DATA:  55 year old female with history of suspected abdominal abscess or infection. EXAM: CT ABDOMEN AND PELVIS WITH CONTRAST TECHNIQUE: Multidetector CT imaging of the abdomen and pelvis was performed using the standard protocol following bolus administration of intravenous contrast. CONTRAST:  OMNIPAQUE IOHEXOL 350 MG/ML SOLN COMPARISON:  CT of the abdomen and pelvis 10/24/2020. FINDINGS: Lower chest: Unremarkable. Hepatobiliary: Tiny subcentimeter low-attenuation lesion in the inferior aspect of segment 6 of the liver, too small to characterize, but statistically likely represent tiny cysts. No other larger more suspicious cystic or solid hepatic lesions. No intra or extrahepatic biliary ductal dilatation. Gallbladder is normal in appearance. Pancreas: No pancreatic mass no pancreatic ductal dilatation. No pancreatic or peripancreatic fluid collections or inflammatory changes. Spleen: Unremarkable. Adrenals/Urinary Tract: Bilateral kidneys and bilateral adrenal glands are normal in appearance. No hydroureteronephrosis. Urinary bladder is normal in appearance Stomach/Bowel: The appearance of the stomach is normal. No pathologic dilatation of small bowel or colon. Normal appendix. Vascular/Lymphatic: Mild atherosclerosis noted throughout the abdominal and pelvic vasculature. No aneurysm or dissection noted in the abdominal or pelvic vasculature. Reproductive: Uterus is enlarged and heterogeneous in appearance with multiple  lesions, largest of which measures up to 7.3 cm in diameter, likely represent a large fibroid. Several of the smaller lesions are densely calcified, also likely fibroids. The ovaries are unremarkable in appearance. Other: In the right upper quadrant immediately anterior to the liver and posterior to the cecum which is displaced into the right upper quadrant there is extensive fat stranding which is similar to the prior examination, but with a small amount of fluid which is new compared to the prior examination. No pneumoperitoneum. Musculoskeletal: There are no aggressive appearing lytic or blastic lesions noted in the visualized portions of the skeleton. IMPRESSION: 1. Extensive fat stranding in the right upper quadrant of the abdomen where there is increasing surrounding inflammation and trace volume of fluid. 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 No well-defined fluid collection is noted to suggest abscess, although the increasing inflammation and trace volume of fluid may suggest developing phlegmon. 2. Normal appendix. 3. Fibroid uterus. 4. Aortic atherosclerosis. Electronically Signed   By: Brayton Mars.D.  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  I  would treat with antibiotics for diverticultis and stay on a liquid diet for a week.  She could go home today.   * No surgery found *    LOS: 1 day   Matt B. Daphine Deutscher, MD, [facility] [phone] to reach the surgeon on call.    10/29/2020 11:12 AM

## 2020-10-29 NOTE — Discharge Instructions (Signed)
Christina Poole,  You in the hospital because of abdominal pain and was found to have evidence of injury to the fat over your intestines, called the omentum.  You are treated with antibiotics and general surgery was consulted.  You have improved.  Prior to discharge, the general surgeon thought that you might have some evidence of diverticulitis and has recommended for you to continue with full liquid diet for the next week in addition to antibiotics.  I have discharged you with 10 days of Augmentin to treat your infection.  Please follow-up with your primary care physician.  Please return if you have worsening symptoms.

## 2020-10-29 NOTE — Discharge Summary (Signed)
Physician Discharge Summary  Christina Poole XFG:[id number] DOB: Aug 28, 1965 DOA: 10/27/2020  PCP: Jarold Motto, PA  Admit date: 10/27/2020 Discharge date: 10/29/2020  Admitted From: Home Disposition: Home  Recommendations for Outpatient Follow-up:  Follow up with PCP in 1 week Full liquid diet for 1 week Please follow up on the following pending results: None  Home Health: None Equipment/Devices: None  Discharge Condition: Stable CODE STATUS: Full code Diet recommendation: Full liquid diet x1 week   Brief/Interim Summary:  Admission HPI written by Eduard Clos, MD   HPI: Christina Poole is a 55 y.o. female with history of hypertension, sarcoidosis, colitis presents to the ER for the second time in the last 3 days with complaint of abdominal pain.  Abdominal pain is mostly localized to the right upper quadrant.  No associated nausea vomiting fever chills or diarrhea.  Pain has been constant with no relation to food.  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  General surgery was consulted on admission.  Patient was placed on empiric Zosyn.  Leukocytosis is mild and trending down.  No fevers. Still with significant tenderness on exam, however pain is overall improved. General surgery assessment is this may be right sided diverticulitis and recommend outpatient antibiotics in addition to liquid diet for one week.   Primary hypertension Patient is on amlodipine, benazepril as an outpatient.  Antihypertensives held secondary to initial n.p.o. status.  Blood pressure improved with resumption of home medications. Continue on discharge.   Sarcoidosis Currently not on medication treatment. Follows with rheumatology   Obesity Body mass index is 36.47 kg/m.   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   cholecalciferol 1000 units tablet Commonly known as: VITAMIN D Take 2,000 Units by mouth daily.   HYDROcodone-acetaminophen 5-325 MG tablet Commonly known as: NORCO/VICODIN Take 1 tablet by mouth every 6 (six) hours as needed.   ondansetron 4 MG disintegrating tablet Commonly known as: Zofran ODT Take 1 tablet (4 mg total) by mouth every 8 (eight) hours as needed for nausea or vomiting.   One Daily Calcium/Iron Tabs Take by mouth.        Allergies  Allergen Reactions   Codeine     Severe unrelenting nausea    Consultations: General surgery   Procedures/Studies: CT Abdomen Pelvis W Contrast  Result Date: 10/27/2020 CLINICAL DATA:  55 year old female with history of suspected abdominal abscess or infection. EXAM: CT ABDOMEN AND PELVIS WITH CONTRAST TECHNIQUE: Multidetector CT imaging of the abdomen and pelvis was performed using the standard protocol following bolus administration of intravenous contrast. CONTRAST:  OMNIPAQUE IOHEXOL 350 MG/ML SOLN COMPARISON:  CT of the abdomen and pelvis 10/24/2020. FINDINGS: Lower chest: Unremarkable. Hepatobiliary: Tiny subcentimeter low-attenuation lesion in the inferior aspect of segment 6 of the liver, too  small to characterize, but statistically likely represent tiny cysts. No other larger more suspicious cystic or solid hepatic lesions. No intra or extrahepatic biliary ductal dilatation. Gallbladder is normal in appearance. Pancreas: No pancreatic mass no pancreatic ductal  dilatation. No pancreatic or peripancreatic fluid collections or inflammatory changes. Spleen: Unremarkable. Adrenals/Urinary Tract: Bilateral kidneys and bilateral adrenal glands are normal in appearance. No hydroureteronephrosis. Urinary bladder is normal in appearance Stomach/Bowel: The appearance of the stomach is normal. No pathologic dilatation of small bowel or colon. Normal appendix. Vascular/Lymphatic: Mild atherosclerosis noted throughout the abdominal and pelvic vasculature. No aneurysm or dissection noted in the abdominal or pelvic vasculature. Reproductive: Uterus is enlarged and heterogeneous in appearance with multiple lesions, largest of which measures up to 7.3 cm in diameter, likely represent a large fibroid. Several of the smaller lesions are densely calcified, also likely fibroids. The ovaries are unremarkable in appearance. Other: In the right upper quadrant immediately anterior to the liver and posterior to the cecum which is displaced into the right upper quadrant there is extensive fat stranding which is similar to the prior examination, but with a small amount of fluid which is new compared to the prior examination. No pneumoperitoneum. Musculoskeletal: There are no aggressive appearing lytic or blastic lesions noted in the visualized portions of the skeleton. IMPRESSION: 1. Extensive fat stranding in the right upper quadrant of the abdomen where there is increasing surrounding inflammation and trace volume of fluid. 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 No well-defined fluid collection is noted to suggest abscess, although the increasing inflammation and trace volume of fluid may suggest developing phlegmon. 2. Normal appendix. 3. Fibroid uterus. 4. Aortic atherosclerosis. Electronically Signed   By: Trudie Reed M.D.   On: 10/27/2020 19:43   CT ABDOMEN PELVIS W CONTRAST  Result Date: 10/24/2020 CLINICAL DATA:  Epigastric pain. EXAM: CT ABDOMEN AND  PELVIS WITH CONTRAST TECHNIQUE: Multidetector CT imaging of the abdomen and pelvis was performed using the standard protocol following bolus administration of intravenous contrast. CONTRAST:  75mL OMNIPAQUE IOHEXOL 350 MG/ML SOLN COMPARISON:  None. FINDINGS: Lower chest: Bibasilar linear atelectasis. No intra-abdominal free air or free fluid. Hepatobiliary: Slight irregularity of the liver contour may represent early changes of cirrhosis. Clinical correlation is recommended. No intrahepatic biliary ductal dilatation. The gallbladder is unremarkable. Pancreas: Unremarkable. No pancreatic ductal dilatation or surrounding inflammatory changes. Spleen: Normal in size without focal abnormality. A small splenule at the splenic hilum and tail of the pancreas. Adrenals/Urinary Tract: The adrenal glands unremarkable. There is no hydronephrosis on either side. There is symmetric enhancement and excretion of contrast by both kidneys. The visualized ureters and urinary bladder appear unremarkable. Stomach/Bowel: Several normal caliber fecalized loops of small bowel in the right lower abdomen may represent increased transit time or small intestinal bacterial overgrowth. There is no bowel obstruction or active inflammation. The appendix is normal. Vascular/Lymphatic: Mild aortoiliac atherosclerotic disease. The IVC is unremarkable. No pain venous gas. There is no adenopathy. Reproductive: The uterus is anteverted. There is a 7.2 cm right uterine body fibroid with mass effect and compression of the endometrium. Several small calcified fibroids noted. Other: There is stranding and inflammatory changes of the mesentery in the right upper quadrant anterior and slightly inferior to the liver. This is of indeterminate etiology but may represent an area of mesenteric infarct secondary to trauma or twisting. Metastatic implant is not excluded. Clinical correlation is recommended. No drainable fluid collection Musculoskeletal: No acute or  significant osseous  findings. IMPRESSION: 1. Inflammatory changes of the mesentery in the right upper quadrant anterior and slightly inferior to the liver. This may represent an area of mesenteric infarct secondary to trauma or twisting. Metastatic implant is not excluded. Clinical correlation is recommended. No fluid collection. 2. No bowel obstruction. Normal appendix. 3. Large right uterine body fibroid with mass effect and compression of the endometrium. 4. Aortic Atherosclerosis (ICD10-I70.0). Electronically Signed   By: Elgie Collard M.D.   On: 10/24/2020 03:56   DG Chest Portable 1 View  Result Date: 10/24/2020 CLINICAL DATA:  Chest pain. EXAM: PORTABLE CHEST 1 VIEW COMPARISON:  Chest radiograph dated 04/08/2017. FINDINGS: No focal consolidation, pleural effusion or pneumothorax. Stable mild cardiomegaly. No acute pathology. IMPRESSION: No active disease. Electronically Signed   By: Elgie Collard M.D.   On: 10/24/2020 02:15   US Abdomen Limited RUQ/Gall Bladder  Result Date: 10/25/2020 CLINICAL DATA:  Epigastric pain EXAM: ULTRASOUND ABDOMEN LIMITED RIGHT UPPER QUADRANT COMPARISON:  CT from earlier in the same day. FINDINGS: Gallbladder: No gallstones or wall thickening visualized. No sonographic Murphy sign noted by sonographer. Common bile duct: Diameter: 3.3 mm Liver: No focal lesion identified. Within normal limits in parenchymal echogenicity. Portal vein is patent on color Doppler imaging with normal direction of blood flow towards the liver. Other: None. IMPRESSION: No acute abnormality in the right upper quadrant. Electronically Signed   By: Alcide Clever M.D.   On: 10/25/2020 09:27     Subjective: No nausea, vomiting. No bowel movement. Pain is overall improved. Passing gas. No issues overnight.  Discharge Exam: Vitals:   10/28/20 2005 10/29/20 0751  BP: (!) 153/91 (!) 155/104  Pulse: 79 73  Resp: 16 18  Temp:  98.5 F (36.9 C)  SpO2: 96% 96%   Vitals:   10/28/20 1617  10/28/20 2005 10/28/20 2005 10/29/20 0751  BP: (!) 172/91 (!) 153/91 (!) 153/91 (!) 155/104  Pulse: 86 79 79 73  Resp: 18 16 16 18   Temp:    98.5 F (36.9 C)  TempSrc: Oral Oral Oral Oral  SpO2: 100% 96% 96% 96%  Weight:      Height:        Examination:   General exam: Appears calm and comfortable Respiratory system: Clear to auscultation. Respiratory effort normal. Cardiovascular system: S1 & S2 heard, RRR. No murmurs, rubs, gallops or clicks. Gastrointestinal system: Abdomen is nondistended, soft and significantly tender localized to one spot in RUQ. No organomegaly or masses felt. Normal bowel sounds heard. Central nervous system: Alert and oriented. No focal neurological deficits. Musculoskeletal: No edema. No calf tenderness Skin: No cyanosis. No rashes Psychiatry: Judgement and insight appear normal. Mood & affect appropriate.     The results of significant diagnostics from this hospitalization (including imaging, microbiology, ancillary and laboratory) are listed below for reference.     Microbiology: Recent Results (from the past 240 hour(s))  Resp Panel by RT-PCR (Flu A&B, Covid) Nasopharyngeal Swab     Status: None   Collection Time: 10/27/20  8:53 PM   Specimen: Nasopharyngeal Swab; Nasopharyngeal(NP) swabs in vial transport medium  Result Value Ref Range Status   SARS Coronavirus 2 by RT PCR NEGATIVE NEGATIVE Final    Comment: (NOTE) SARS-CoV-2 target nucleic acids are NOT DETECTED.  The SARS-CoV-2 RNA is generally detectable in upper respiratory specimens during the acute phase of infection. The lowest concentration of SARS-CoV-2 viral copies this assay can detect is 138 copies/mL. A negative result does not preclude SARS-Cov-2 infection and should  not be used as the sole basis for treatment or other patient management decisions. A negative result may occur with  improper specimen collection/handling, submission of specimen other than nasopharyngeal swab,  presence of viral mutation(s) within the areas targeted by this assay, and inadequate number of viral copies(<138 copies/mL). A negative result must be combined with clinical observations, patient history, and epidemiological information. The expected result is Negative.  Fact Sheet for Patients:  BloggerCourse.comhttps://www.fda.gov/media/152166/download  Fact Sheet for Healthcare Providers:  SeriousBroker.ithttps://www.fda.gov/media/152162/download  This test is no t yet approved or cleared by the [place] FDA and  has been authorized for detection and/or diagnosis of SARS-CoV-2 by FDA under an Emergency Use Authorization (EUA). This EUA will remain  in effect (meaning this test can be used) for the duration of the COVID-19 declaration under Section 564(b)(1) of the Act, 21 U.S.C.section 360bbb-3(b)(1), unless the authorization is terminated  or revoked sooner.       Influenza A by PCR NEGATIVE NEGATIVE Final   Influenza B by PCR NEGATIVE NEGATIVE Final    Comment: (NOTE) The Xpert Xpress SARS-CoV-2/FLU/RSV plus assay is intended as an aid in the diagnosis of influenza from Nasopharyngeal swab specimens and should not be used as a sole basis for treatment. Nasal washings and aspirates are unacceptable for Xpert Xpress SARS-CoV-2/FLU/RSV testing.  Fact Sheet for Patients: BloggerCourse.comhttps://www.fda.gov/media/152166/download  Fact Sheet for Healthcare Providers: SeriousBroker.ithttps://www.fda.gov/media/152162/download  This test is not yet approved or cleared by the [place] FDA and has been authorized for detection and/or diagnosis of SARS-CoV-2 by FDA under an Emergency Use Authorization (EUA). This EUA will remain in effect (meaning this test can be used) for the duration of the COVID-19 declaration under Section 564(b)(1) of the Act, 21 U.S.C. section 360bbb-3(b)(1), unless the authorization is terminated or revoked.  Performed at [facility], [address], [place], [place] [address]       Labs: BNP (last 3 results) No results for input(s): BNP in the last 8760 hours. Basic Metabolic Panel: Recent Labs  Lab 10/24/20 0142 10/27/20 1720 10/28/20 1237  NA 138 140 139  K 3.6 3.8 3.9  CL 105 108 105  CO2 24 23 26   GLUCOSE 110* 91 113*  BUN 13 16 10   CREATININE 0.80 0.65 0.88  CALCIUM 9.5 8.7* 8.9   Liver Function Tests: Recent Labs  Lab 10/24/20 0142 10/27/20 1720 10/28/20 1237  AST 14* 19 21  ALT 10 15 19   ALKPHOS 69 74 82  BILITOT 0.7 0.4 0.8  PROT 7.8 7.3 7.4  ALBUMIN 4.0 3.5 3.0*   Recent Labs  Lab 10/24/20 0142 10/27/20 1720  LIPASE 40 17   No results for input(s): AMMONIA in the last 168 hours. CBC: Recent Labs  Lab 10/24/20 0142 10/27/20 1259 10/28/20 1237 10/29/20 0057  WBC 11.7* 12.8* 11.0* 10.7*  NEUTROABS 7.8*  --  7.8*  --   HGB 12.6 12.7 11.7* 11.7*  HCT 38.9 38.7 36.7 35.9*  MCV 91.5 89.8 92.0 91.6  PLT 262 332 343 325   Cardiac Enzymes: No results for input(s): CKTOTAL, CKMB, CKMBINDEX, TROPONINI in the last 168 hours. BNP: Invalid input(s): POCBNP CBG: Recent Labs  Lab 10/28/20 0828 10/28/20 1620 10/28/20 2342  GLUCAP 128* 92 115*   D-Dimer No results for input(s): DDIMER in the last 72 hours. Hgb A1c No results for input(s): HGBA1C in the last 72 hours. Lipid Profile No results for input(s): CHOL, HDL, LDLCALC, TRIG, CHOLHDL, LDLDIRECT in the last 72 hours. Thyroid function studies No  results for input(s): TSH, T4TOTAL, T3FREE, THYROIDAB in the last 72 hours.  Invalid input(s): FREET3 Anemia work up No results for input(s): VITAMINB12, FOLATE, FERRITIN, TIBC, IRON, RETICCTPCT in the last 72 hours. Urinalysis    Component Value Date/Time   COLORURINE YELLOW 10/27/2020 1901   APPEARANCEUR CLEAR 10/27/2020 1901   LABSPEC >1.046 (H) 10/27/2020 1901   PHURINE 6.0 10/27/2020 1901   GLUCOSEU NEGATIVE 10/27/2020 1901   HGBUR NEGATIVE 10/27/2020 1901   BILIRUBINUR NEGATIVE 10/27/2020 1901   KETONESUR NEGATIVE  10/27/2020 1901   PROTEINUR 30 (A) 10/27/2020 1901   UROBILINOGEN 0.2 02/12/2014 1704   NITRITE NEGATIVE 10/27/2020 1901   LEUKOCYTESUR NEGATIVE 10/27/2020 1901   Sepsis Labs Invalid input(s): PROCALCITONIN,  WBC,  LACTICIDVEN Microbiology Recent Results (from the past 240 hour(s))  Resp Panel by RT-PCR (Flu A&B, Covid) Nasopharyngeal Swab     Status: None   Collection Time: 10/27/20  8:53 PM   Specimen: Nasopharyngeal Swab; Nasopharyngeal(NP) swabs in vial transport medium  Result Value Ref Range Status   SARS Coronavirus 2 by RT PCR NEGATIVE NEGATIVE Final    Comment: (NOTE) SARS-CoV-2 target nucleic acids are NOT DETECTED.  The SARS-CoV-2 RNA is generally detectable in upper respiratory specimens during the acute phase of infection. The lowest concentration of SARS-CoV-2 viral copies this assay can detect is 138 copies/mL. A negative result does not preclude SARS-Cov-2 infection and should not be used as the sole basis for treatment or other patient management decisions. A negative result may occur with  improper specimen collection/handling, submission of specimen other than nasopharyngeal swab, presence of viral mutation(s) within the areas targeted by this assay, and inadequate number of viral copies(<138 copies/mL). A negative result must be combined with clinical observations, patient history, and epidemiological information. The expected result is Negative.  Fact Sheet for Patients:  BloggerCourse.com  Fact Sheet for Healthcare Providers:  SeriousBroker.it  This test is no t yet approved or cleared by the [place] FDA and  has been authorized for detection and/or diagnosis of SARS-CoV-2 by FDA under an Emergency Use Authorization (EUA). This EUA will remain  in effect (meaning this test can be used) for the duration of the COVID-19 declaration under Section 564(b)(1) of the Act, 21 U.S.C.section 360bbb-3(b)(1),  unless the authorization is terminated  or revoked sooner.       Influenza A by PCR NEGATIVE NEGATIVE Final   Influenza B by PCR NEGATIVE NEGATIVE Final    Comment: (NOTE) The Xpert Xpress SARS-CoV-2/FLU/RSV plus assay is intended as an aid in the diagnosis of influenza from Nasopharyngeal swab specimens and should not be used as a sole basis for treatment. Nasal washings and aspirates are unacceptable for Xpert Xpress SARS-CoV-2/FLU/RSV testing.  Fact Sheet for Patients: BloggerCourse.com  Fact Sheet for Healthcare Providers: SeriousBroker.it  This test is not yet approved or cleared by the [place] FDA and has been authorized for detection and/or diagnosis of SARS-CoV-2 by FDA under an Emergency Use Authorization (EUA). This EUA will remain in effect (meaning this test can be used) for the duration of the COVID-19 declaration under Section 564(b)(1) of the Act, 21 U.S.C. section 360bbb-3(b)(1), unless the authorization is terminated or revoked.  Performed at [facility], [address] [place], [place] [address]      Time coordinating discharge: 35 minutes  SIGNED:   Jacquelin Hawking, MD [facility] 10/29/2020, 1:35 PM

## 2020-10-29 NOTE — Progress Notes (Signed)
PROGRESS NOTE    Christina Poole  WPY:[id number] DOB: Jun 27, 1965 DOA: 10/27/2020 PCP: Jarold Motto, PA   Brief Narrative: Christina Poole is a 55 y.o. female with a history of hypertension, sarcoidosis, colitis.  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  Patient started on analgesic therapy in addition to antibiotics.  General surgery consulted and plan is for conservative management.   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  General surgery was consulted on admission.  Patient was placed on empiric Zosyn.  Leukocytosis is mild and trending down.  No fevers. Still with significant tenderness on exam, however pain is overall improved. -Continue Zosyn IV -Trend CBC -General surgery recommendations: pending today  Primary hypertension Patient is on amlodipine, benazepril as an outpatient.  Antihypertensives held secondary to initial n.p.o. status.  Blood pressures continue to be uncontrolled. -Continue home amlodipine and benazepril; may need to increase dosage  Sarcoidosis Currently not on medication treatment. Follows with rheumatology  Obesity Body mass index is 36.47 kg/m.   DVT prophylaxis: SCDs Code Status:   Code Status: Full Code Family Communication: Husband on telephone Disposition Plan: Discharge home likely in 1 to 2 days pending general surgery recommendations, transition to oral antibiotics   Consultants:  General surgery  Procedures:  None  Antimicrobials: Zosyn IV   Subjective: No nausea, vomiting. No bowel movement. Pain is overall improved. Passing gas. No issues overnight.  Objective: Vitals:   10/28/20 1617 10/28/20 2005 10/28/20  2005 10/29/20 0751  BP: (!) 172/91 (!) 153/91 (!) 153/91 (!) 155/104  Pulse: 86 79 79 73  Resp: 18 16 16 18   Temp: 98.3 F (36.8 C) 98.7 F (37.1 C) 98.7 F (37.1 C) 98.5 F (36.9 C)  TempSrc: Oral Oral Oral Oral  SpO2: 100% 96% 96% 96%  Weight:      Height:        Intake/Output Summary (Last 24 hours) at 10/29/2020 1006 Last data filed at 10/28/2020 1500 Gross per 24 hour  Intake 643.09 ml  Output --  Net 643.09 ml    Filed Weights   10/27/20 1247 10/28/20 0556  Weight: 98 kg 99.4 kg    Examination:  General exam: Appears calm and comfortable Respiratory system: Clear to auscultation. Respiratory effort normal. Cardiovascular system: S1 & S2 heard, RRR. No murmurs, rubs, gallops or clicks. Gastrointestinal system: Abdomen is nondistended, soft and significantly tender localized to one spot in RUQ. No organomegaly or masses felt. Normal bowel sounds heard. Central nervous system: Alert and oriented. No focal neurological deficits. Musculoskeletal: No edema. No calf tenderness Skin: No cyanosis. No rashes Psychiatry: Judgement and insight appear normal. Mood & affect appropriate.     Data Reviewed: I have personally reviewed following labs and imaging studies  CBC Lab Results  Component Value Date   WBC 10.7 (H) 10/29/2020   RBC 3.92 10/29/2020   HGB 11.7 (L) 10/29/2020   HCT 35.9 (L) 10/29/2020   MCV 91.6 10/29/2020   MCH 29.8 10/29/2020   PLT 325 10/29/2020   MCHC 32.6 10/29/2020   RDW 13.1 10/29/2020   LYMPHSABS 2.1 10/28/2020   MONOABS 0.5 10/28/2020   EOSABS 0.6 (H) 10/28/2020   BASOSABS 0.0 10/28/2020  Last metabolic panel Lab Results  Component Value Date   NA 139 10/28/2020   K 3.9 10/28/2020   CL 105 10/28/2020   CO2 26 10/28/2020   BUN 10 10/28/2020   CREATININE 0.88 10/28/2020   GLUCOSE 113 (H) 10/28/2020   GFRNONAA >60 10/28/2020   GFRAA >90 02/12/2014   CALCIUM 8.9 10/28/2020   PROT 7.4 10/28/2020   ALBUMIN 3.0 (L) 10/28/2020    BILITOT 0.8 10/28/2020   ALKPHOS 82 10/28/2020   AST 21 10/28/2020   ALT 19 10/28/2020   ANIONGAP 8 10/28/2020    CBG (last 3)  Recent Labs    10/28/20 0828 10/28/20 1620 10/28/20 2342  GLUCAP 128* 92 115*      GFR: Estimated Creatinine Clearance: 86.4 mL/min (by C-G formula based on SCr of 0.88 mg/dL).  Coagulation Profile: No results for input(s): INR, PROTIME in the last 168 hours.  Recent Results (from the past 240 hour(s))  Resp Panel by RT-PCR (Flu A&B, Covid) Nasopharyngeal Swab     Status: None   Collection Time: 10/27/20  8:53 PM   Specimen: Nasopharyngeal Swab; Nasopharyngeal(NP) swabs in vial transport medium  Result Value Ref Range Status   SARS Coronavirus 2 by RT PCR NEGATIVE NEGATIVE Final    Comment: (NOTE) SARS-CoV-2 target nucleic acids are NOT DETECTED.  The SARS-CoV-2 RNA is generally detectable in upper respiratory specimens during the acute phase of infection. The lowest concentration of SARS-CoV-2 viral copies this assay can detect is 138 copies/mL. A negative result does not preclude SARS-Cov-2 infection and should not be used as the sole basis for treatment or other patient management decisions. A negative result may occur with  improper specimen collection/handling, submission of specimen other than nasopharyngeal swab, presence of viral mutation(s) within the areas targeted by this assay, and inadequate number of viral copies(<138 copies/mL). A negative result must be combined with clinical observations, patient history, and epidemiological information. The expected result is Negative.  Fact Sheet for Patients:  BloggerCourse.com  Fact Sheet for Healthcare Providers:  SeriousBroker.it  This test is no t yet approved or cleared by the [place] FDA and  has been authorized for detection and/or diagnosis of SARS-CoV-2 by FDA under an Emergency Use Authorization (EUA). This EUA will  remain  in effect (meaning this test can be used) for the duration of the COVID-19 declaration under Section 564(b)(1) of the Act, 21 U.S.C.section 360bbb-3(b)(1), unless the authorization is terminated  or revoked sooner.       Influenza A by PCR NEGATIVE NEGATIVE Final   Influenza B by PCR NEGATIVE NEGATIVE Final    Comment: (NOTE) The Xpert Xpress SARS-CoV-2/FLU/RSV plus assay is intended as an aid in the diagnosis of influenza from Nasopharyngeal swab specimens and should not be used as a sole basis for treatment. Nasal washings and aspirates are unacceptable for Xpert Xpress SARS-CoV-2/FLU/RSV testing.  Fact Sheet for Patients: BloggerCourse.com  Fact Sheet for Healthcare Providers: SeriousBroker.it  This test is not yet approved or cleared by the [place] FDA and has been authorized for detection and/or diagnosis of SARS-CoV-2 by FDA under an Emergency Use Authorization (EUA). This EUA will remain in effect (meaning this test can be used) for the duration of the COVID-19 declaration under Section 564(b)(1) of the Act, 21 U.S.C. section 360bbb-3(b)(1), unless the authorization is terminated or revoked.  Performed at [facility], [address], [place], [place] [address]          Radiology Studies: CT Abdomen Pelvis  W Contrast  Result Date: 10/27/2020 CLINICAL DATA:  55 year old female with history of suspected abdominal abscess or infection. EXAM: CT ABDOMEN AND PELVIS WITH CONTRAST TECHNIQUE: Multidetector CT imaging of the abdomen and pelvis was performed using the standard protocol following bolus administration of intravenous contrast. CONTRAST:  OMNIPAQUE IOHEXOL 350 MG/ML SOLN COMPARISON:  CT of the abdomen and pelvis 10/24/2020. FINDINGS: Lower chest: Unremarkable. Hepatobiliary: Tiny subcentimeter low-attenuation lesion in the inferior aspect of segment 6 of the liver, too small to  characterize, but statistically likely represent tiny cysts. No other larger more suspicious cystic or solid hepatic lesions. No intra or extrahepatic biliary ductal dilatation. Gallbladder is normal in appearance. Pancreas: No pancreatic mass no pancreatic ductal dilatation. No pancreatic or peripancreatic fluid collections or inflammatory changes. Spleen: Unremarkable. Adrenals/Urinary Tract: Bilateral kidneys and bilateral adrenal glands are normal in appearance. No hydroureteronephrosis. Urinary bladder is normal in appearance Stomach/Bowel: The appearance of the stomach is normal. No pathologic dilatation of small bowel or colon. Normal appendix. Vascular/Lymphatic: Mild atherosclerosis noted throughout the abdominal and pelvic vasculature. No aneurysm or dissection noted in the abdominal or pelvic vasculature. Reproductive: Uterus is enlarged and heterogeneous in appearance with multiple lesions, largest of which measures up to 7.3 cm in diameter, likely represent a large fibroid. Several of the smaller lesions are densely calcified, also likely fibroids. The ovaries are unremarkable in appearance. Other: In the right upper quadrant immediately anterior to the liver and posterior to the cecum which is displaced into the right upper quadrant there is extensive fat stranding which is similar to the prior examination, but with a small amount of fluid which is new compared to the prior examination. No pneumoperitoneum. Musculoskeletal: There are no aggressive appearing lytic or blastic lesions noted in the visualized portions of the skeleton. IMPRESSION: 1. Extensive fat stranding in the right upper quadrant of the abdomen where there is increasing surrounding inflammation and trace volume of fluid. 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 No well-defined fluid collection is noted to suggest abscess, although the increasing inflammation and trace volume of fluid may suggest  developing phlegmon. 2. Normal appendix. 3. Fibroid uterus. 4. Aortic atherosclerosis. Electronically Signed   By: Trudie Reed M.D.   On: 10/27/2020 19:43        Scheduled Meds:  amLODipine  5 mg Oral Daily   And   benazepril  20 mg Oral Daily   saccharomyces boulardii  250 mg Oral BID   Continuous Infusions:  piperacillin-tazobactam (ZOSYN)  IV 3.375 g (10/29/20 0618)     LOS: 1 day     Jacquelin Hawking, MD [facility] 10/29/2020, 10:06 AM  If 7PM-7AM, please contact night-coverage [email or web address]

## 2020-10-29 NOTE — Plan of Care (Signed)
  Problem: Education: Goal: Knowledge of medication regimen will be met for pain relief regimen by discharge Outcome: Progressing Goal: Understanding of ways to prevent infection will improve by discharge Outcome: Progressing

## 2020-11-02 ENCOUNTER — Encounter (INDEPENDENT_AMBULATORY_CARE_PROVIDER_SITE_OTHER): Payer: Self-pay

## 2020-11-15 ENCOUNTER — Other Ambulatory Visit: Payer: Self-pay | Admitting: Physician Assistant

## 2020-11-18 ENCOUNTER — Encounter: Payer: Self-pay | Admitting: Physician Assistant

## 2020-11-18 ENCOUNTER — Other Ambulatory Visit: Payer: Self-pay

## 2020-11-18 ENCOUNTER — Ambulatory Visit (INDEPENDENT_AMBULATORY_CARE_PROVIDER_SITE_OTHER): Payer: BC Managed Care – PPO | Admitting: Physician Assistant

## 2020-11-18 VITALS — BP 119/78 | HR 85 | Temp 98.2°F | Ht 65.0 in | Wt 216.6 lb

## 2020-11-18 DIAGNOSIS — K529 Noninfective gastroenteritis and colitis, unspecified: Secondary | ICD-10-CM

## 2020-11-18 NOTE — Progress Notes (Signed)
Christina Poole is a 55 y.o. female here for a follow up of a pre-existing problem.  History of Present Illness:   Chief Complaint  Patient presents with  . Hospitalization Follow-up    HPI  Abdominal pain follow-up Patient was admitted to the hospital from 9/1 to 10/29/2020.  She had exquisite abdominal pain and had a CT scan in the emergency room which was concerning for possible omental infarction.  General surgery was consulted and she was given empiric antibiotics, Zosyn.  Surgery followed her while she was in the hospital and determined that her pain was likely from colitis rather than possible omental infarct.  She was discharged on Augmentin and recommended to remain on a liquid diet for 1 week.  She continues to overall do well since hospital discharge.  She has been having regular bowel movements, normal food intake, and overall reduction in pain.  She has had a colonoscopy, but I cannot find record of this.  She states that she has never had a diagnosis of diverticulosis and has never had a diagnosis of diverticulitis.   Past Medical History:  Diagnosis Date  . Colitis   . Hypertension   . Pericarditis 2007  . Sarcoidosis      Social History   Tobacco Use  . Smoking status: Never  . Smokeless tobacco: Never  Vaping Use  . Vaping Use: Never used  Substance Use Topics  . Alcohol use: No  . Drug use: No    Past Surgical History:  Procedure Laterality Date  . APPENDECTOMY    . CESAREAN SECTION    . MYOMECTOMY      Family History  Problem Relation Age of Onset  . Colon cancer Father        in his 4s  . Diabetes Mother   . Hypercholesterolemia Mother     Allergies  Allergen Reactions  . Codeine     Severe unrelenting nausea    Current Medications:   Current Outpatient Medications:  .  amLODipine-benazepril (LOTREL) 5-20 MG capsule, TAKE 1 CAPSULE BY MOUTH EVERY DAY, Disp: 30 capsule, Rfl: 0 .  cholecalciferol (VITAMIN D) 1000 units tablet, Take 2,000  Units by mouth daily., Disp: , Rfl:  .  Multiple Vitamins-Minerals (ONE DAILY CALCIUM/IRON) TABS, Take by mouth., Disp: , Rfl:    Review of Systems:   ROS Negative unless otherwise specified per HPI.  Vitals:   Vitals:   11/18/20 1610  BP: 119/78  Pulse: 85  Temp: 98.2 F (36.8 C)  TempSrc: Temporal  SpO2: 96%  Weight: 216 lb 9.6 oz (98.2 kg)  Height: 5\' 5"  (1.651 m)     Body mass index is 36.04 kg/m.  Physical Exam:   Physical Exam Vitals and nursing note reviewed.  Constitutional:      General: She is not in acute distress.    Appearance: She is well-developed. She is not ill-appearing or toxic-appearing.  Cardiovascular:     Rate and Rhythm: Normal rate and regular rhythm.     Pulses: Normal pulses.     Heart sounds: Normal heart sounds, S1 normal and S2 normal.  Pulmonary:     Effort: Pulmonary effort is normal.     Breath sounds: Normal breath sounds.  Abdominal:     General: Abdomen is flat. Bowel sounds are normal.     Palpations: Abdomen is soft.     Tenderness: There is no abdominal tenderness.  Skin:    General: Skin is warm and dry.  Neurological:  Mental Status: She is alert.     GCS: GCS eye subscore is 4. GCS verbal subscore is 5. GCS motor subscore is 6.  Psychiatric:        Speech: Speech normal.        Behavior: Behavior normal. Behavior is cooperative.    Assessment and Plan:   Colitis Symptoms have essentially resolved.  Exam was normal today. Patient is wanting further evaluation and guidance as to why her symptoms started and how to manage them in future. She tells me that this is her first episode of diverticulitis without any known history of diverticulosis. I am going to put in a referral for her to see the gastroenterologist who does her colonoscopies so she can discuss this with him further. I recommend that she continue to work on healthy diet and monitor for any return of abdominal pain.   Jarold Motto, PA-C

## 2020-11-18 NOTE — Patient Instructions (Addendum)
It was great to see you!  I will reach out to Dr. Hulen Shouts office -- I think that you need to see them to follow-up on this unusual episode of your colitis.  Take care,  Jarold Motto PA-C

## 2020-11-29 ENCOUNTER — Other Ambulatory Visit: Payer: Self-pay | Admitting: Physician Assistant

## 2021-01-18 DIAGNOSIS — Z0289 Encounter for other administrative examinations: Secondary | ICD-10-CM

## 2021-02-15 ENCOUNTER — Telehealth: Payer: Self-pay | Admitting: Internal Medicine

## 2021-02-15 NOTE — Telephone Encounter (Signed)
ATC patient, LMTCB patient has not been seen since 2019 so we need more clarification from her as to what she needs.

## 2021-02-16 NOTE — Telephone Encounter (Signed)
Attempted to call pt but unable to reach. Unable to leave VM as mailbox is full. Message was left yesterday by [facility] for pt. Due to multiple attempts trying to reach pt and unable to do so, per protocol encounter will be closed.

## 2021-03-07 ENCOUNTER — Encounter (INDEPENDENT_AMBULATORY_CARE_PROVIDER_SITE_OTHER): Payer: Self-pay | Admitting: Bariatrics

## 2021-03-07 ENCOUNTER — Other Ambulatory Visit: Payer: Self-pay

## 2021-03-07 ENCOUNTER — Ambulatory Visit (INDEPENDENT_AMBULATORY_CARE_PROVIDER_SITE_OTHER): Payer: BC Managed Care – PPO | Admitting: Bariatrics

## 2021-03-07 VITALS — BP 142/87 | HR 72 | Temp 98.2°F | Ht 62.0 in | Wt 215.0 lb

## 2021-03-07 DIAGNOSIS — Z1331 Encounter for screening for depression: Secondary | ICD-10-CM | POA: Diagnosis not present

## 2021-03-07 DIAGNOSIS — R7309 Other abnormal glucose: Secondary | ICD-10-CM

## 2021-03-07 DIAGNOSIS — E7849 Other hyperlipidemia: Secondary | ICD-10-CM | POA: Diagnosis not present

## 2021-03-07 DIAGNOSIS — Z6839 Body mass index (BMI) 39.0-39.9, adult: Secondary | ICD-10-CM

## 2021-03-07 DIAGNOSIS — I10 Essential (primary) hypertension: Secondary | ICD-10-CM | POA: Diagnosis not present

## 2021-03-07 DIAGNOSIS — G4733 Obstructive sleep apnea (adult) (pediatric): Secondary | ICD-10-CM | POA: Diagnosis not present

## 2021-03-07 DIAGNOSIS — E66812 Obesity, class 2: Secondary | ICD-10-CM

## 2021-03-07 DIAGNOSIS — R0602 Shortness of breath: Secondary | ICD-10-CM

## 2021-03-07 DIAGNOSIS — R5383 Other fatigue: Secondary | ICD-10-CM

## 2021-03-07 DIAGNOSIS — E559 Vitamin D deficiency, unspecified: Secondary | ICD-10-CM

## 2021-03-07 NOTE — Progress Notes (Signed)
Chief Complaint:   OBESITY Christina Poole (MR# [id number]) is a 56 y.o. female who presents for evaluation and treatment of obesity and related comorbidities. Current BMI is Body mass index is 39.32 kg/m. Analiz has been struggling with her weight for many years and has been unsuccessful in either losing weight, maintaining weight loss, or reaching her healthy weight goal.  [name] writes that she does not like to cook and need recipes. She does not drink enough water.   [name] is currently in the action stage of change and ready to dedicate time achieving and maintaining a healthier weight. [name] is interested in becoming our patient and working on intensive lifestyle modifications including (but not limited to) diet and exercise for weight loss.  [name]'s habits were reviewed today and are as follows: Her family eats meals together, her desired weight loss is  80 pounds  , she started gaining weight after 52, her heaviest weight ever was 220 pounds, she has significant food cravings issues, she snacks frequently in the evenings, she skips meals frequently, she is frequently drinking liquids with calories, she has binge eating behaviors, and she struggles with emotional eating.  Depression Screen [name]'s Food and Mood (modified PHQ-9) score was 8.  Depression screen PHQ 2/9 03/07/2021  Decreased Interest 1  Down, Depressed, Hopeless 1  PHQ - 2 Score 2  Altered sleeping 3  Tired, decreased energy 1  Change in appetite 1  Feeling bad or failure about yourself  0  Trouble concentrating 1  Moving slowly or fidgety/restless 0  Suicidal thoughts 0  PHQ-9 Score 8  Difficult doing work/chores Not difficult at all   Subjective:   1. Other fatigue [name] reports daytime somnolence and admits to waking up still tired. Patent has a history of symptoms of morning fatigue. [name] generally gets 5 hours of sleep per night, and states that she has difficulty falling asleep. Snoring is  present. Apneic episodes is present. Epworth Sleepiness Score is 7.   2. SOB (shortness of breath) on exertion Zenobia notes increasing shortness of breath with exercising and seems to be worsening over time with weight gain. She notes getting out of breath sooner with activity than she used to. This has not gotten worse recently. [name] denies shortness of breath at rest or orthopnea.   3. Essential hypertension [name]'s blood pressure is reasonably well controlled. Her blood pressure today was 142/87.  4. OSA (obstructive sleep apnea) [name] has been using her CPAP for 1 month.  5. Other hyperlipidemia [name] is not on medications currently. 6. Elevated glucose [name]'s last glucose was 115 on 10/28/2020.  7. Vitamin D deficiency [name] is currently taking Vitamin D.   Assessment/Plan:   1. Other fatigue [name] does feel that her weight is causing her energy to be lower than it should be. Fatigue may be related to obesity, depression or many other causes. Labs will be ordered, and in the meanwhile, Marcus will focus on self care including making healthy food choices, increasing physical activity and focusing on stress reduction. We will check TSH, T4F and T3Free today.   - TSH+T4F+T3Free  2. SOB (shortness of breath) on exertion [name] does feel that she gets out of breath more easily that she used to when she exercises. [name]'s shortness of breath appears to be obesity related and exercise induced. She has agreed to work on weight loss and gradually increase exercise to treat her exercise induced shortness of breath. Will continue to monitor closely.  3. Essential hypertension [name] will continue her medications. She is working on healthy weight loss and exercise to improve blood pressure control. We will watch for signs of hypotension as she continues her lifestyle modifications.  4. OSA (obstructive sleep apnea) [name] will continue using her CPAP nightly. Intensive lifestyle  modifications are the first line treatment for this issue. We discussed several lifestyle modifications today and she will continue to work on diet, exercise and weight loss efforts. We will continue to monitor. Orders and follow up as documented in patient record.   5. Other hyperlipidemia Cardiovascular risk and specific lipid/LDL goals reviewed.  We will check Lipid panel today. We discussed several lifestyle modifications today and [name] will continue to work on diet, exercise and weight loss efforts. Orders and follow up as documented in patient record.   Counseling Intensive lifestyle modifications are the first line treatment for this issue. Dietary changes: Increase soluble fiber. Decrease simple carbohydrates. Exercise changes: Moderate to vigorous-intensity aerobic activity 150 minutes per week if tolerated. Lipid-lowering medications: see documented in medical record.   - Lipid Panel With LDL/HDL Ratio  6. Elevated glucose We will check A1C and Insulin today.   - Insulin, random - Hemoglobin A1c  7. Vitamin D deficiency Low Vitamin D level contributes to fatigue and are associated with obesity, breast, and colon cancer. We will check Vitamin D today and [name] will follow-up for routine testing of Vitamin D, at least 2-3 times per year to avoid over-replacement.  - VITAMIN D 25 Hydroxy (Vit-D Deficiency, Fractures)  8. Depression screening [name] had a negative depression screening. Depression is commonly associated with obesity and often results in emotional eating behaviors. We will monitor this closely and work on CBT to help improve the non-hunger eating patterns. Referral to Psychology may be required if no improvement is seen as she continues in our clinic.   9. Class 2 severe obesity with serious comorbidity and body mass index (BMI) of 39.0 to 39.9 in adult, unspecified obesity type (HCC) [name] is currently in the action stage of change and her goal is to continue  with weight loss efforts. I recommend [name] begin the structured treatment plan as follows:  She has agreed to the Category 2 Plan.  [name] will continue meal planning. We reviewed labs from 10/28/2020 CMP, CBC and glucose. She will have no sugary drinks.   Exercise goals:  Kersten will start walking.     Behavioral modification strategies: increasing lean protein intake, decreasing simple carbohydrates, increasing vegetables, increasing water intake, decreasing eating out, no skipping meals, meal planning and cooking strategies, keeping healthy foods in the home, and planning for success.  She was informed of the importance of frequent follow-up visits to maximize her success with intensive lifestyle modifications for her multiple health conditions. She was informed we would discuss her lab results at her next visit unless there is a critical issue that needs to be addressed sooner. Willine agreed to keep her next visit at the agreed upon time to discuss these results.  Objective:   Blood pressure (!) 142/87, pulse 72, temperature 98.2 F (36.8 C), height 5\' 2"  (1.575 m), weight 215 lb (97.5 kg), SpO2 98 %. Body mass index is 39.32 kg/m.  EKG: Normal sinus rhythm, rate 65 bpm.  Indirect Calorimeter completed today shows a VO2 of 247 and a REE of 1699.  Her calculated basal metabolic rate is thus her basal metabolic rate is better than expected.  General: Cooperative, alert, well developed, in  no acute distress. HEENT: Conjunctivae and lids unremarkable. Cardiovascular: Regular rhythm.  Lungs: Normal work of breathing. Neurologic: No focal deficits.   Lab Results  Component Value Date   CREATININE 0.88 10/28/2020   BUN 10 10/28/2020   NA 139 10/28/2020   K 3.9 10/28/2020   CL 105 10/28/2020   CO2 26 10/28/2020   Lab Results  Component Value Date   ALT 19 10/28/2020   AST 21 10/28/2020   ALKPHOS 82 10/28/2020   BILITOT 0.8 10/28/2020   Lab Results  Component Value Date    HGBA1C 6.6 (H) 08/31/2020   No results found for: INSULIN No results found for: TSH Lab Results  Component Value Date   CHOL 225 (H) 09/18/2016   HDL 54.00 09/18/2016   LDLCALC 149 (H) 09/18/2016   TRIG 109.0 09/18/2016   CHOLHDL 4 09/18/2016   Lab Results  Component Value Date   WBC 10.7 (H) 10/29/2020   HGB 11.7 (L) 10/29/2020   HCT 35.9 (L) 10/29/2020   MCV 91.6 10/29/2020   PLT 325 10/29/2020   No results found for: IRON, TIBC, FERRITIN  Attestation Statements:   Reviewed by clinician on day of visit: allergies, medications, problem list, medical history, surgical history, family history, social history, and previous encounter notes.  I, Jackson LatinoAlisa White, RMA, am acting as Energy managertranscriptionist for [employer] Energy[employer]el Bradshaw Minihan, DO.  I have reviewed the above documentation for accuracy and completeness, and I agree with the above. Corinna Capra- Perian Tedder, DO

## 2021-03-08 ENCOUNTER — Encounter (INDEPENDENT_AMBULATORY_CARE_PROVIDER_SITE_OTHER): Payer: Self-pay | Admitting: Bariatrics

## 2021-03-08 DIAGNOSIS — E1169 Type 2 diabetes mellitus with other specified complication: Secondary | ICD-10-CM | POA: Insufficient documentation

## 2021-03-08 LAB — TSH+T4F+T3FREE
Free T4: 1.1 ng/dL (ref 0.82–1.77)
T3, Free: 3.1 pg/mL (ref 2.0–4.4)
TSH: 1.03 u[IU]/mL (ref 0.450–4.500)

## 2021-03-08 LAB — INSULIN, RANDOM: INSULIN: 13 u[IU]/mL (ref 2.6–24.9)

## 2021-03-08 LAB — VITAMIN D 25 HYDROXY (VIT D DEFICIENCY, FRACTURES): Vit D, 25-Hydroxy: 41.4 ng/mL (ref 30.0–100.0)

## 2021-03-08 LAB — LIPID PANEL WITH LDL/HDL RATIO
Cholesterol, Total: 238 mg/dL — ABNORMAL HIGH (ref 100–199)
HDL: 47 mg/dL (ref >39)
LDL Chol Calc (NIH): 170 mg/dL — ABNORMAL HIGH (ref 0–99)
LDL/HDL Ratio: 3.6 ratio — ABNORMAL HIGH (ref 0.0–3.2)
Triglycerides: 117 mg/dL (ref 0–149)
VLDL Cholesterol Cal: 21 mg/dL (ref 5–40)

## 2021-03-08 LAB — HEMOGLOBIN A1C
Est. average glucose Bld gHb Est-mCnc: 134 mg/dL
Hgb A1c MFr Bld: 6.3 % — ABNORMAL HIGH (ref 4.8–5.6)

## 2021-03-12 ENCOUNTER — Encounter (INDEPENDENT_AMBULATORY_CARE_PROVIDER_SITE_OTHER): Payer: Self-pay | Admitting: Bariatrics

## 2021-03-12 DIAGNOSIS — Z6839 Body mass index (BMI) 39.0-39.9, adult: Secondary | ICD-10-CM | POA: Insufficient documentation

## 2021-03-12 DIAGNOSIS — E6609 Other obesity due to excess calories: Secondary | ICD-10-CM | POA: Insufficient documentation

## 2021-03-20 ENCOUNTER — Encounter (INDEPENDENT_AMBULATORY_CARE_PROVIDER_SITE_OTHER): Payer: Self-pay

## 2021-03-21 ENCOUNTER — Other Ambulatory Visit: Payer: Self-pay

## 2021-03-21 ENCOUNTER — Encounter (INDEPENDENT_AMBULATORY_CARE_PROVIDER_SITE_OTHER): Payer: Self-pay | Admitting: Bariatrics

## 2021-03-21 ENCOUNTER — Ambulatory Visit (INDEPENDENT_AMBULATORY_CARE_PROVIDER_SITE_OTHER): Payer: BC Managed Care – PPO | Admitting: Bariatrics

## 2021-03-21 VITALS — BP 144/84 | HR 81 | Temp 98.2°F | Ht 62.0 in | Wt 214.0 lb

## 2021-03-21 DIAGNOSIS — E669 Obesity, unspecified: Secondary | ICD-10-CM | POA: Diagnosis not present

## 2021-03-21 DIAGNOSIS — Z6839 Body mass index (BMI) 39.0-39.9, adult: Secondary | ICD-10-CM

## 2021-03-21 DIAGNOSIS — R7303 Prediabetes: Secondary | ICD-10-CM | POA: Diagnosis not present

## 2021-03-21 DIAGNOSIS — E785 Hyperlipidemia, unspecified: Secondary | ICD-10-CM | POA: Diagnosis not present

## 2021-03-21 DIAGNOSIS — E1169 Type 2 diabetes mellitus with other specified complication: Secondary | ICD-10-CM

## 2021-03-21 NOTE — Progress Notes (Signed)
Chief Complaint:   OBESITY Christina Poole is here to discuss her progress with her obesity treatment plan along with follow-up of her obesity related diagnoses. Christina Poole is on the Category 2 Plan and states she is following her eating plan approximately 50% of the time. Christina Poole states she is doing 0 minutes 0 times per week.  Today's visit was #: 2 Starting weight: 215 lbs Starting date: 03/07/2021 Today's weight: 214 lbs Today's date: 03/21/2021 Total lbs lost to date: 1 lb Total lbs lost since last in-office visit: 1 lb  Interim History: July is down 1 lb since her last visit. She states that she will stop the sweet tea. She is drinking a protein shake.   Subjective:   1. Prediabetes Christina Poole is not on medications currently. Her last A1C was 6.3. Her insulin was 13.0.  2. Hyperlipidemia associated with type 2 diabetes mellitus (HCC) She is not taking any medications at this time and wants to work on the plan and exercise,   Assessment/Plan:   1. Prediabetes Handout on Insulin resistance and pre-diabetes was provided today. Terresa will continue to work on weight loss, exercise, and decreasing simple carbohydrates to help decrease the risk of diabetes.   2. Hyperlipidemia associated with type 2 diabetes mellitus (HCC) Cardiovascular risk and specific lipid/LDL goals reviewed.  We discussed several lifestyle modifications today and Christina Poole will continue to work on diet, exercise and weight loss efforts. She will have no trans fats and she will minimize saturated fats. Orders and follow up as documented in patient record.   Counseling Intensive lifestyle modifications are the first line treatment for this issue. Dietary changes: Increase soluble fiber. Decrease simple carbohydrates. Exercise changes: Moderate to vigorous-intensity aerobic activity 150 minutes per week if tolerated. Lipid-lowering medications: see documented in medical record.  3. Obesity, current BMI 39.2 Christina Poole is  currently in the action stage of change. As such, her goal is to continue with weight loss efforts. She has agreed to the Category 2 Plan.   We reviewed labs from 03/07/2021 Lipids, Vitamin D, A1C, glucose, insulin and thyroid panel. Christina Poole will stop all sugary drinks. She was provided with "[employer] [place] Road" handout today.   Exercise goals: No exercise has been prescribed at this time.  Behavioral modification strategies: increasing lean protein intake, decreasing simple carbohydrates, increasing vegetables, increasing water intake, decreasing eating out, no skipping meals, meal planning and cooking strategies, keeping healthy foods in the home, and planning for success.  Christina Poole has agreed to follow-up with our clinic in 2 weeks. She was informed of the importance of frequent follow-up visits to maximize her success with intensive lifestyle modifications for her multiple health conditions.   Objective:   Blood pressure (!) 144/84, pulse 81, temperature 98.2 F (36.8 C), height 5\' 2"  (1.575 m), weight 214 lb (97.1 kg), SpO2 97 %. Body mass index is 39.14 kg/m.  General: Cooperative, alert, well developed, in no acute distress. HEENT: Conjunctivae and lids unremarkable. Cardiovascular: Regular rhythm.  Lungs: Normal work of breathing. Neurologic: No focal deficits.   Lab Results  Component Value Date   CREATININE 0.88 10/28/2020   BUN 10 10/28/2020   NA 139 10/28/2020   K 3.9 10/28/2020   CL 105 10/28/2020   CO2 26 10/28/2020   Lab Results  Component Value Date   ALT 19 10/28/2020   AST 21 10/28/2020   ALKPHOS 82 10/28/2020   BILITOT 0.8 10/28/2020   Lab Results  Component Value Date   HGBA1C 6.3 (  H) 03/07/2021   HGBA1C 6.6 (H) 08/31/2020   Lab Results  Component Value Date   INSULIN 13.0 03/07/2021   Lab Results  Component Value Date   TSH 1.030 03/07/2021   Lab Results  Component Value Date   CHOL 238 (H) 03/07/2021   HDL 47 03/07/2021   LDLCALC 170 (H) 03/07/2021    TRIG 117 03/07/2021   CHOLHDL 4 09/18/2016   Lab Results  Component Value Date   VD25OH 41.4 03/07/2021   VD25OH 26.25 (L) 08/12/2017   Lab Results  Component Value Date   WBC 10.7 (H) 10/29/2020   HGB 11.7 (L) 10/29/2020   HCT 35.9 (L) 10/29/2020   MCV 91.6 10/29/2020   PLT 325 10/29/2020   No results found for: IRON, TIBC, FERRITIN  Attestation Statements:   Reviewed by clinician on day of visit: allergies, medications, problem list, medical history, surgical history, family history, social history, and previous encounter notes.  I, Lizbeth Bark, RMA, am acting as Location manager for [employer], DO.  I have reviewed the above documentation for accuracy and completeness, and I agree with the above. Jearld Lesch, DO

## 2021-03-22 ENCOUNTER — Encounter (INDEPENDENT_AMBULATORY_CARE_PROVIDER_SITE_OTHER): Payer: Self-pay | Admitting: Bariatrics

## 2021-04-05 ENCOUNTER — Encounter (INDEPENDENT_AMBULATORY_CARE_PROVIDER_SITE_OTHER): Payer: Self-pay | Admitting: Bariatrics

## 2021-04-05 ENCOUNTER — Ambulatory Visit (INDEPENDENT_AMBULATORY_CARE_PROVIDER_SITE_OTHER): Payer: BC Managed Care – PPO | Admitting: Bariatrics

## 2021-04-05 ENCOUNTER — Other Ambulatory Visit: Payer: Self-pay

## 2021-04-05 VITALS — BP 137/90 | HR 80 | Temp 97.9°F | Ht 62.0 in | Wt 212.0 lb

## 2021-04-05 DIAGNOSIS — I10 Essential (primary) hypertension: Secondary | ICD-10-CM | POA: Diagnosis not present

## 2021-04-05 DIAGNOSIS — Z6838 Body mass index (BMI) 38.0-38.9, adult: Secondary | ICD-10-CM | POA: Diagnosis not present

## 2021-04-05 DIAGNOSIS — E669 Obesity, unspecified: Secondary | ICD-10-CM | POA: Diagnosis not present

## 2021-04-05 DIAGNOSIS — E7849 Other hyperlipidemia: Secondary | ICD-10-CM

## 2021-04-06 NOTE — Progress Notes (Signed)
Chief Complaint:   OBESITY Christina Poole is here to discuss her progress with her obesity treatment plan along with follow-up of her obesity related diagnoses. Christina Poole is on the Category 2 Plan and states she is following her eating plan approximately 30% of the time. Christina Poole states she is doing 0 minutes 0 times per week.  Today's visit was #: 3 Starting weight: 215 lbs Starting date: 03/07/2021 Today's weight: 212 lbs Today's date: 04/05/2021 Total lbs lost to date: 3 lbs Total lbs lost since last in-office visit: 2 lbs  Interim History: Christina Poole is down an additional 1 lb since her last visit. It has been hard to get all of her meal planning etc, organized for her days..   Subjective:   1. Essential hypertension Christina Poole is currently taking Lotrel. Her blood pressure is reasonably controlled. Her systolic was slightly high today 144/84.  2. Other hyperlipidemia Christina Poole is not on medications currently.   Assessment/Plan:   1. Essential hypertension Christina Poole will continue taking Lotrel. She is working on healthy weight loss and exercise to improve blood pressure control. We will watch for signs of hypotension as she continues her lifestyle modifications.  2. Other hyperlipidemia Cardiovascular risk and specific lipid/LDL goals reviewed.  Christina Poole will continue to reduce her saturated fats and increase MUFAs. We discussed several lifestyle modifications today and Christina Poole will continue to work on diet, exercise and weight loss efforts. Orders and follow up as documented in patient record.   Counseling Intensive lifestyle modifications are the first line treatment for this issue. Dietary changes: Increase soluble fiber. Decrease simple carbohydrates. Exercise changes: Moderate to vigorous-intensity aerobic activity 150 minutes per week if tolerated. Lipid-lowering medications: see documented in medical record.  3. Obesity, current BMI 38.8 Christina Poole is currently in the action stage of change. As  such, her goal is to continue with weight loss efforts. She has agreed to the Category 2 Plan.   Christina Poole will continue meal planning and she will be mindful eating.  Exercise goals:  Christina Poole will start water exercise.   Behavioral modification strategies: increasing lean protein intake, decreasing simple carbohydrates, increasing vegetables, increasing water intake, decreasing eating out, no skipping meals, meal planning and cooking strategies, keeping healthy foods in the home, and planning for success.  Christina Poole has agreed to follow-up with our clinic in 2 weeks. She was informed of the importance of frequent follow-up visits to maximize her success with intensive lifestyle modifications for her multiple health conditions.   Objective:   Blood pressure 137/90, pulse 80, temperature 97.9 F (36.6 C), height 5\' 2"  (1.575 m), weight 212 lb (96.2 kg), SpO2 97 %. Body mass index is 38.78 kg/m.  General: Cooperative, alert, well developed, in no acute distress. HEENT: Conjunctivae and lids unremarkable. Cardiovascular: Regular rhythm.  Lungs: Normal work of breathing. Neurologic: No focal deficits.   Lab Results  Component Value Date   CREATININE 0.88 10/28/2020   BUN 10 10/28/2020   NA 139 10/28/2020   K 3.9 10/28/2020   CL 105 10/28/2020   CO2 26 10/28/2020   Lab Results  Component Value Date   ALT 19 10/28/2020   AST 21 10/28/2020   ALKPHOS 82 10/28/2020   BILITOT 0.8 10/28/2020   Lab Results  Component Value Date   HGBA1C 6.3 (H) 03/07/2021   HGBA1C 6.6 (H) 08/31/2020   Lab Results  Component Value Date   INSULIN 13.0 03/07/2021   Lab Results  Component Value Date   TSH 1.030 03/07/2021  Lab Results  Component Value Date   CHOL 238 (H) 03/07/2021   HDL 47 03/07/2021   LDLCALC 170 (H) 03/07/2021   TRIG 117 03/07/2021   CHOLHDL 4 09/18/2016   Lab Results  Component Value Date   VD25OH 41.4 03/07/2021   VD25OH 26.25 (L) 08/12/2017   Lab Results  Component  Value Date   WBC 10.7 (H) 10/29/2020   HGB 11.7 (L) 10/29/2020   HCT 35.9 (L) 10/29/2020   MCV 91.6 10/29/2020   PLT 325 10/29/2020   No results found for: IRON, TIBC, FERRITIN  Attestation Statements:   Reviewed by clinician on day of visit: allergies, medications, problem list, medical history, surgical history, family history, social history, and previous encounter notes.  I, Jackson Latino, RMA, am acting as Energy manager for [employer], DO.  I have reviewed the above documentation for accuracy and completeness, and I agree with the above. Corinna Capra, DO

## 2021-04-09 ENCOUNTER — Encounter (INDEPENDENT_AMBULATORY_CARE_PROVIDER_SITE_OTHER): Payer: Self-pay | Admitting: Bariatrics

## 2021-04-25 ENCOUNTER — Encounter: Payer: Self-pay | Admitting: Physician Assistant

## 2021-04-26 ENCOUNTER — Encounter (INDEPENDENT_AMBULATORY_CARE_PROVIDER_SITE_OTHER): Payer: Self-pay | Admitting: Adult Health

## 2021-04-26 ENCOUNTER — Other Ambulatory Visit: Payer: Self-pay

## 2021-04-26 ENCOUNTER — Ambulatory Visit (INDEPENDENT_AMBULATORY_CARE_PROVIDER_SITE_OTHER): Payer: BC Managed Care – PPO | Admitting: Adult Health

## 2021-04-26 VITALS — BP 149/84 | HR 69 | Temp 98.0°F | Ht 62.0 in | Wt 212.0 lb

## 2021-04-26 DIAGNOSIS — Z6838 Body mass index (BMI) 38.0-38.9, adult: Secondary | ICD-10-CM | POA: Diagnosis not present

## 2021-04-26 DIAGNOSIS — E669 Obesity, unspecified: Secondary | ICD-10-CM

## 2021-04-26 DIAGNOSIS — I10 Essential (primary) hypertension: Secondary | ICD-10-CM | POA: Diagnosis not present

## 2021-04-26 DIAGNOSIS — R7303 Prediabetes: Secondary | ICD-10-CM | POA: Diagnosis not present

## 2021-04-27 NOTE — Progress Notes (Signed)
? ? ? ?Chief Complaint:  ? ?OBESITY ?Christina Poole is here to discuss her progress with her obesity treatment plan along with follow-up of her obesity related diagnoses. Manjot is on the Category 2 Plan and states she is following her eating plan approximately 10% of the time. Leanndra states she is doing step class/dance for 60 minutes 2 times per week. ? ?Today's visit was #: 4 ?Starting weight: 215 lbs ?Starting date: 03/07/2021 ?Today's weight: 212 lbs ?Today's date: 04/26/2021 ?Total lbs lost to date: 3 lbs ?Total lbs lost since last in-office visit: 0 ? ?Interim History:  ?Ellia lives with her husband and 40 year old twins (boy and girl). ?The children attend school remotely and accompany their father to his job at [employer]- professor. ?Kalani endorses the following typical day of intake- ?Breakfast - coffee. ?Lunch - microwave meal. ?Dinner - protein, vegetables. ?Snack -Premier Protein shake. ? ?Subjective:  ? ?1. Essential hypertension ?SBP remains above goal. ?She has been on Lotrel 5/20 mg daily since 2012. ?Denies cardiac symptoms. ?Last four pressure readings in Epic ?142/87 ?144/84 ?137/90 ?144/84 ? ?2. Prediabetes ?Her mother has T2D. ?She is not currently on any blood glucose lowering medications. ?She denies polyphagia. ? ?Assessment/Plan:  ? ?1. Essential hypertension ?Monitor ambulatory BP at home - bring log to follow-up visit. ? ?2. Prediabetes ?Increase protein and regular exercise. ? ?3. Obesity, current BMI 38.8 ? ?Dorma is currently in the action stage of change. As such, her goal is to continue with weight loss efforts. She has agreed to the Category 2 Plan.  ? ?Breakfast on the road. ? ?Exercise goals:  As is. ? ?Behavioral modification strategies: increasing lean protein intake, decreasing simple carbohydrates, meal planning and cooking strategies, keeping healthy foods in the home, dealing with family or coworker sabotage, and planning for success. ? ?Natilie has agreed to follow-up with our  clinic in 2-3 weeks. She was informed of the importance of frequent follow-up visits to maximize her success with intensive lifestyle modifications for her multiple health conditions.  ? ?Objective:  ? ?Blood pressure (!) 149/84, pulse 69, temperature 98 ?F (36.7 ?C), height 5\' 2"  (1.575 m), weight 212 lb (96.2 kg), SpO2 99 %. ?Body mass index is 38.78 kg/m?. ? ?General: Cooperative, alert, well developed, in no acute distress. ?HEENT: Conjunctivae and lids unremarkable. ?Cardiovascular: Regular rhythm.  ?Lungs: Normal work of breathing. ?Neurologic: No focal deficits.  ? ?Lab Results  ?Component Value Date  ? CREATININE 0.88 10/28/2020  ? BUN 10 10/28/2020  ? NA 139 10/28/2020  ? K 3.9 10/28/2020  ? CL 105 10/28/2020  ? CO2 26 10/28/2020  ? ?Lab Results  ?Component Value Date  ? ALT 19 10/28/2020  ? AST 21 10/28/2020  ? ALKPHOS 82 10/28/2020  ? BILITOT 0.8 10/28/2020  ? ?Lab Results  ?Component Value Date  ? HGBA1C 6.3 (H) 03/07/2021  ? HGBA1C 6.6 (H) 08/31/2020  ? ?Lab Results  ?Component Value Date  ? INSULIN 13.0 03/07/2021  ? ?Lab Results  ?Component Value Date  ? TSH 1.030 03/07/2021  ? ?Lab Results  ?Component Value Date  ? CHOL 238 (H) 03/07/2021  ? HDL 47 03/07/2021  ? LDLCALC 170 (H) 03/07/2021  ? TRIG 117 03/07/2021  ? CHOLHDL 4 09/18/2016  ? ?Lab Results  ?Component Value Date  ? VD25OH 41.4 03/07/2021  ? VD25OH 26.25 (L) 08/12/2017  ? ?Lab Results  ?Component Value Date  ? WBC 10.7 (H) 10/29/2020  ? HGB 11.7 (L) 10/29/2020  ? HCT 35.9 (  L) 10/29/2020  ? MCV 91.6 10/29/2020  ? PLT 325 10/29/2020  ? ?Attestation Statements:  ? ?Reviewed by clinician on day of visit: allergies, medications, problem list, medical history, surgical history, family history, social history, and previous encounter notes. ? ?Time spent on visit including pre-visit chart review and post-visit care and charting was 28 minutes.  ? ?I, Insurance claims handler, CMA, am acting as Energy manager for William Hamburger, NP. ? ?I have reviewed the above  documentation for accuracy and completeness, and I agree with the above. - Tonita Bills d. Zeda Gangwer, NP-C ?

## 2021-05-17 ENCOUNTER — Ambulatory Visit (INDEPENDENT_AMBULATORY_CARE_PROVIDER_SITE_OTHER): Payer: BC Managed Care – PPO | Admitting: Adult Health

## 2021-05-30 ENCOUNTER — Encounter (INDEPENDENT_AMBULATORY_CARE_PROVIDER_SITE_OTHER): Payer: Self-pay | Admitting: Nurse Practitioner

## 2021-05-30 ENCOUNTER — Ambulatory Visit (INDEPENDENT_AMBULATORY_CARE_PROVIDER_SITE_OTHER): Payer: BC Managed Care – PPO | Admitting: Nurse Practitioner

## 2021-05-30 VITALS — BP 111/76 | HR 62 | Temp 98.0°F | Ht 62.0 in | Wt 213.0 lb

## 2021-05-30 DIAGNOSIS — Z6839 Body mass index (BMI) 39.0-39.9, adult: Secondary | ICD-10-CM

## 2021-05-30 DIAGNOSIS — E785 Hyperlipidemia, unspecified: Secondary | ICD-10-CM

## 2021-05-30 DIAGNOSIS — E669 Obesity, unspecified: Secondary | ICD-10-CM | POA: Diagnosis not present

## 2021-05-30 DIAGNOSIS — I10 Essential (primary) hypertension: Secondary | ICD-10-CM

## 2021-05-31 NOTE — Progress Notes (Signed)
? ? ? ?Chief Complaint:  ? ?OBESITY ?Christina Poole is here to discuss her progress with her obesity treatment plan along with follow-up of her obesity related diagnoses. Christina Poole is on the Category 2 Plan and states she is following her eating plan approximately 40% of the time. Christina Poole states she is walking for 30 minutes 2 times per week. ? ?Today's visit was #: 5 ?Starting weight: 215 lbs ?Starting date: 03/07/2021 ?Today's weight: 213 lbs ?Today's date: 05/30/2021 ?Total lbs lost to date: 2 lbs ?Total lbs lost since last in-office visit: 0 ? ?Interim History: Christina Poole is struggling with following meal plan. She reports hunger and cravings for sweets. Breakfast - coffee with collagen. Lunch - microwave meals. Dinner - protein with vegetables. Snack - protein shake. She would like to use a meal prep company.  ? ?Subjective:  ? ?1. Essential hypertension ?Christina Poole's blood pressure looks good today 111/76. She is currently taking Lotrel 5-20 mg. She denies side effects chest pains, shortness of breath or palpitations.  ? ?2. Hyperlipidemia, unspecified hyperlipidemia type ?Christina Poole has never been on a statin.  ? ?Assessment/Plan:  ? ?1. Essential hypertension ?Christina Poole will continue to follow up with her primary care provider. She will continue medications as directed. She is working on healthy weight loss and exercise to improve blood pressure control. We will watch for signs of hypotension as she continues her lifestyle modifications. ? ?2. Hyperlipidemia, unspecified hyperlipidemia type ?Cardiovascular risk and specific lipid/LDL goals reviewed.  We will monitor will continue to monitor. I recommended rechecking labs in 1-3 months. We discussed several lifestyle modifications today and Christina Poole will continue to work on diet, exercise and weight loss efforts. Orders and follow up as documented in patient record.  ? ?Counseling ?Intensive lifestyle modifications are the first line treatment for this issue. ?Dietary changes: Increase  soluble fiber. Decrease simple carbohydrates. ?Exercise changes: Moderate to vigorous-intensity aerobic activity 150 minutes per week if tolerated. ?Lipid-lowering medications: see documented in medical record. ? ?3. Obesity, current BMI 39.1 ?Christina Poole is currently in the action stage of change. As such, her goal is to continue with weight loss efforts. She has agreed to keeping a food journal and adhering to recommended goals of 1200 calories and 85 grams of protein.  ? ?Handout give: Proein substitution for 2 ounces of meat.  ? ?Exercise goals:  As is.  ? ?Behavioral modification strategies: increasing lean protein intake, increasing water intake, no skipping meals, and meal planning and cooking strategies. ? ?Christina Poole has agreed to follow-up with our clinic in 2 weeks. She was informed of the importance of frequent follow-up visits to maximize her success with intensive lifestyle modifications for her multiple health conditions.  ? ?Objective:  ? ?Blood pressure 111/76, pulse 62, temperature 98 ?F (36.7 ?C), height 5\' 2"  (1.575 m), weight 213 lb (96.6 kg), SpO2 98 %. ?Body mass index is 38.96 kg/m?. ? ?General: Cooperative, alert, well developed, in no acute distress. ?HEENT: Conjunctivae and lids unremarkable. ?Cardiovascular: Regular rhythm.  ?Lungs: Normal work of breathing. ?Neurologic: No focal deficits.  ? ?Lab Results  ?Component Value Date  ? CREATININE 0.88 10/28/2020  ? BUN 10 10/28/2020  ? NA 139 10/28/2020  ? K 3.9 10/28/2020  ? CL 105 10/28/2020  ? CO2 26 10/28/2020  ? ?Lab Results  ?Component Value Date  ? ALT 19 10/28/2020  ? AST 21 10/28/2020  ? ALKPHOS 82 10/28/2020  ? BILITOT 0.8 10/28/2020  ? ?Lab Results  ?Component Value Date  ? HGBA1C 6.3 (H) 03/07/2021  ?  HGBA1C 6.6 (H) 08/31/2020  ? ?Lab Results  ?Component Value Date  ? INSULIN 13.0 03/07/2021  ? ?Lab Results  ?Component Value Date  ? TSH 1.030 03/07/2021  ? ?Lab Results  ?Component Value Date  ? CHOL 238 (H) 03/07/2021  ? HDL 47 03/07/2021   ? LDLCALC 170 (H) 03/07/2021  ? TRIG 117 03/07/2021  ? CHOLHDL 4 09/18/2016  ? ?Lab Results  ?Component Value Date  ? VD25OH 41.4 03/07/2021  ? VD25OH 26.25 (L) 08/12/2017  ? ?Lab Results  ?Component Value Date  ? WBC 10.7 (H) 10/29/2020  ? HGB 11.7 (L) 10/29/2020  ? HCT 35.9 (L) 10/29/2020  ? MCV 91.6 10/29/2020  ? PLT 325 10/29/2020  ? ?No results found for: IRON, TIBC, FERRITIN ? ?Attestation Statements:  ? ?Reviewed by clinician on day of visit: allergies, medications, problem list, medical history, surgical history, family history, social history, and previous encounter notes. ? ?I, Jackson Latino, RMA, am acting as Energy manager for Irene Limbo, FNP. ? ?I have reviewed the above documentation for accuracy and completeness, and I agree with the above. Irene Limbo, FNP  ?

## 2021-06-14 ENCOUNTER — Ambulatory Visit (INDEPENDENT_AMBULATORY_CARE_PROVIDER_SITE_OTHER): Payer: BC Managed Care – PPO | Admitting: Nurse Practitioner

## 2021-06-14 ENCOUNTER — Encounter (INDEPENDENT_AMBULATORY_CARE_PROVIDER_SITE_OTHER): Payer: Self-pay | Admitting: Nurse Practitioner

## 2021-06-14 VITALS — BP 123/76 | HR 68 | Temp 97.9°F | Ht 62.0 in | Wt 213.0 lb

## 2021-06-14 DIAGNOSIS — E669 Obesity, unspecified: Secondary | ICD-10-CM

## 2021-06-14 DIAGNOSIS — R7303 Prediabetes: Secondary | ICD-10-CM

## 2021-06-14 DIAGNOSIS — Z6839 Body mass index (BMI) 39.0-39.9, adult: Secondary | ICD-10-CM | POA: Diagnosis not present

## 2021-06-14 DIAGNOSIS — I10 Essential (primary) hypertension: Secondary | ICD-10-CM

## 2021-06-14 MED ORDER — WEGOVY 0.25 MG/0.5ML ~~LOC~~ SOAJ: 0.2500 mg | SUBCUTANEOUS | 0 refills | Status: DC

## 2021-06-19 ENCOUNTER — Telehealth (INDEPENDENT_AMBULATORY_CARE_PROVIDER_SITE_OTHER): Payer: Self-pay | Admitting: Nurse Practitioner

## 2021-06-19 ENCOUNTER — Encounter (INDEPENDENT_AMBULATORY_CARE_PROVIDER_SITE_OTHER): Payer: Self-pay

## 2021-06-19 NOTE — Telephone Encounter (Signed)
Prior authorization approved for We[employer]. Effective: 06/15/2021 - 01/15/2022. Patient sent approval message via mychart.  ?

## 2021-06-22 NOTE — Progress Notes (Signed)
? ? ? ?Chief Complaint:  ? ?OBESITY ?Christina Poole is here to discuss her progress with her obesity treatment plan along with follow-up of her obesity related diagnoses. Christina Poole is on the Category 2 Plan and states she is following her eating plan approximately 40 to 50% of the time. Christina Poole states she is walking 30 minutes 2 to 3 times per week. ? ?Today's visit was #: 6 ?Starting weight: 215 lbs ?Starting date: 03/07/21 ?Today's weight: 214 lbs ?Today's date: 06/14/2021 ?Total lbs lost to date: 1 ?Total lbs lost since last in-office visit: 1 ? ?Interim History: Christina Poole tried meal prepping with [employer] for 1 week since her last visit. Christina Poole reports hunger and denies cravings. She is drinking water and tea occasionally. ? ?Subjective:  ? ?1. Essential hypertension ?Christina Poole's blood pressure looks good today. She is taking Lotrel 5-20mg .She denies side effects and chest pain, shortness of breath, or palpitations. ? ?2. Prediabetes ?Christina Poole's last A1c was 6.3 and she is not on a medication. Christina Poole reports polyphagia. ? ?Assessment/Plan:  ? ?1. Essential hypertension ?Christina Poole agrees to continue to follow up with her PCP. She agrees to continue medications as directed. ? ?2. Prediabetes ?Christina Poole will continue to work on weight loss, exercise, and decreasing simple carbohydrates to help decrease the risk of diabetes.  ? ?3. Obesity, current BMI 39.3 ?Christina Poole agrees to start [facility] and side effects were discussed.  Denies gallstones, medullary thyroid cancer, MEN 2 or pancreatitis.   ? ?- Semaglutide-Weight Management (WEGOVY) 0.25 MG/0.5ML SOAJ; Inject 0.25 mg into the skin once a week.  Dispense: 2 mL; Refill: 0 ? ?Christina Poole is currently in the action stage of change. As such, her goal is to continue with weight loss efforts. She has agreed to keeping a food journal and adhering to recommended goals of 1200 calories and 85+ grams of protein.  ? ?Exercise goals:  As is. ? ?Behavioral modification strategies: increasing lean protein  intake, increasing water intake, and no skipping meals. ? ?Christina Poole has agreed to follow-up with our clinic in 3 weeks. She was informed of the importance of frequent follow-up visits to maximize her success with intensive lifestyle modifications for her multiple health conditions.  ? ?Objective:  ? ?Blood pressure 123/76, pulse 68, temperature 97.9 ?F (36.6 ?C), height 5\' 2"  (1.575 m), weight 213 lb (96.6 kg), SpO2 96 %. ?Body mass index is 38.96 kg/m?. ? ?General: Cooperative, alert, well developed, in no acute distress. ?HEENT: Conjunctivae and lids unremarkable. ?Cardiovascular: Regular rhythm.  ?Lungs: Normal work of breathing. ?Neurologic: No focal deficits.  ? ?Lab Results  ?Component Value Date  ? CREATININE 0.88 10/28/2020  ? BUN 10 10/28/2020  ? NA 139 10/28/2020  ? K 3.9 10/28/2020  ? CL 105 10/28/2020  ? CO2 26 10/28/2020  ? ?Lab Results  ?Component Value Date  ? ALT 19 10/28/2020  ? AST 21 10/28/2020  ? ALKPHOS 82 10/28/2020  ? BILITOT 0.8 10/28/2020  ? ?Lab Results  ?Component Value Date  ? HGBA1C 6.3 (H) 03/07/2021  ? HGBA1C 6.6 (H) 08/31/2020  ? ?Lab Results  ?Component Value Date  ? INSULIN 13.0 03/07/2021  ? ?Lab Results  ?Component Value Date  ? TSH 1.030 03/07/2021  ? ?Lab Results  ?Component Value Date  ? CHOL 238 (H) 03/07/2021  ? HDL 47 03/07/2021  ? LDLCALC 170 (H) 03/07/2021  ? TRIG 117 03/07/2021  ? CHOLHDL 4 09/18/2016  ? ?Lab Results  ?Component Value Date  ? VD25OH 41.4 03/07/2021  ? VD25OH 26.25 (L)  08/12/2017  ? ?Lab Results  ?Component Value Date  ? WBC 10.7 (H) 10/29/2020  ? HGB 11.7 (L) 10/29/2020  ? HCT 35.9 (L) 10/29/2020  ? MCV 91.6 10/29/2020  ? PLT 325 10/29/2020  ? ?No results found for: IRON, TIBC, FERRITIN ? ?Attestation Statements:  ? ?Reviewed by clinician on day of visit: allergies, medications, problem list, medical history, surgical history, family history, social history, and previous encounter notes. ? ?Time spent on visit including pre-visit chart review and post-visit  care and charting was 30 minutes.  ? ?I, Kirke Corin, CMA, am acting as transcriptionist for Irene Limbo, FNP ? ?I have reviewed the above documentation for accuracy and completeness, and I agree with the above. Irene Limbo, FNP  ?

## 2021-07-11 ENCOUNTER — Ambulatory Visit (INDEPENDENT_AMBULATORY_CARE_PROVIDER_SITE_OTHER): Payer: BC Managed Care – PPO | Admitting: Nurse Practitioner

## 2021-07-11 ENCOUNTER — Encounter (INDEPENDENT_AMBULATORY_CARE_PROVIDER_SITE_OTHER): Payer: Self-pay | Admitting: Nurse Practitioner

## 2021-07-11 VITALS — BP 126/81 | HR 75 | Temp 98.2°F | Ht 62.0 in | Wt 213.0 lb

## 2021-07-11 DIAGNOSIS — Z6839 Body mass index (BMI) 39.0-39.9, adult: Secondary | ICD-10-CM

## 2021-07-11 DIAGNOSIS — I10 Essential (primary) hypertension: Secondary | ICD-10-CM

## 2021-07-11 DIAGNOSIS — R110 Nausea: Secondary | ICD-10-CM

## 2021-07-11 DIAGNOSIS — E669 Obesity, unspecified: Secondary | ICD-10-CM

## 2021-07-11 MED ORDER — WEGOVY 0.25 MG/0.5ML ~~LOC~~ SOAJ: 0.2500 mg | SUBCUTANEOUS | 0 refills | Status: DC

## 2021-07-11 MED ORDER — ONDANSETRON HCL 4 MG PO TABS: 4.0000 mg | ORAL_TABLET | Freq: Three times a day (TID) | ORAL | 0 refills | Status: DC | PRN

## 2021-07-12 NOTE — Progress Notes (Signed)
? ? ? ?Chief Complaint:  ? ?OBESITY ?Christina Poole is here to discuss her progress with her obesity treatment plan along with follow-up of her obesity related diagnoses. [name] is on the Category 2 Plan and keeping a food journal and adhering to recommended goals of 1200 calories and 85 grams of protein and states she is following her eating plan approximately 50% of the time. Alpha states she is walking for 30 minutes 4 times per week. ? ?Today's visit was #: 7 ?Starting weight: 215 lbs ?Starting date: 03/07/2021 ?Today's weight: 213 lbs ?Today's date: 07/11/2021 ?Total lbs lost to date: 2 lbs ?Total lbs lost since last in-office visit: 0 ? ?Interim History: Christina Poole is taking Wegovy 0.25 mg. She reports some side effects of nausea based upon what she eats  and feeling bloated. She is struggling with eating everything on her meal plan. She is feeling full faster. She denies hunger or cravings. She is drinking water but not enough. She has increased walking to 4 days per week. She is eating fried chicken daily.  ? ?Subjective:  ? ?1. Essential hypertension ?Sheletha's blood pressure looks good today. She is taking Lotrel 5-20 mg. She denies side effects she denies chest pain, shortness of breath, and palpitations.  ? ?2. Nausea ?Remington notes some nausea since starting Wegovy.  ? ?Assessment/Plan:  ? ?1. Essential hypertension ?Takeira will continue to follow up with her primary care physician. She will continue with medications as directed. She is working on healthy weight loss and exercise to improve blood pressure control. We will watch for signs of hypotension as she continues her lifestyle modifications. ? ?2. Nausea ?Zofran 4 mg by mouth every 8 hours for 1 month with no refills.  Side effects discussed.  ? ?- ondansetron (ZOFRAN) 4 MG tablet; Take 1 tablet (4 mg total) by mouth every 8 (eight) hours as needed for nausea or vomiting.  Dispense: 20 tablet; Refill: 0 ? ?3. Obesity, current BMI 39.0 ?Christina Poole is currently in  the action stage of change. As such, her goal is to continue with weight loss efforts. She has agreed to the Category 2 Plan.  ? ?Christina Poole will continue Wegovy 0.25 mg for 1 month with no refills. We discussed side effects. We will obtain labs at next visit.  ? ?- Semaglutide-Weight Management (WEGOVY) 0.25 MG/0.5ML SOAJ; Inject 0.25 mg into the skin once a week.  Dispense: 2 mL; Refill: 0 ? ?Exercise goals:  As is.  ? ?Behavioral modification strategies: increasing water intake, no skipping meals, and planning for success. ? ?Christina Poole has agreed to follow-up with our clinic in 4 weeks. She was informed of the importance of frequent follow-up visits to maximize her success with intensive lifestyle modifications for her multiple health conditions.  ? ?Objective:  ? ?Blood pressure 126/81, pulse 75, temperature 98.2 ?F (36.8 ?C), height 5\' 2"  (1.575 m), weight 213 lb (96.6 kg), SpO2 97 %. ?Body mass index is 38.96 kg/m?. ? ?General: Cooperative, alert, well developed, in no acute distress. ?HEENT: Conjunctivae and lids unremarkable. ?Cardiovascular: Regular rhythm.  ?Lungs: Normal work of breathing. ?Neurologic: No focal deficits.  ? ?Lab Results  ?Component Value Date  ? CREATININE 0.88 10/28/2020  ? BUN 10 10/28/2020  ? NA 139 10/28/2020  ? K 3.9 10/28/2020  ? CL 105 10/28/2020  ? CO2 26 10/28/2020  ? ?Lab Results  ?Component Value Date  ? ALT 19 10/28/2020  ? AST 21 10/28/2020  ? ALKPHOS 82 10/28/2020  ? BILITOT 0.8 10/28/2020  ? ?  Lab Results  ?Component Value Date  ? HGBA1C 6.3 (H) 03/07/2021  ? HGBA1C 6.6 (H) 08/31/2020  ? ?Lab Results  ?Component Value Date  ? INSULIN 13.0 03/07/2021  ? ?Lab Results  ?Component Value Date  ? TSH 1.030 03/07/2021  ? ?Lab Results  ?Component Value Date  ? CHOL 238 (H) 03/07/2021  ? HDL 47 03/07/2021  ? LDLCALC 170 (H) 03/07/2021  ? TRIG 117 03/07/2021  ? CHOLHDL 4 09/18/2016  ? ?Lab Results  ?Component Value Date  ? VD25OH 41.4 03/07/2021  ? VD25OH 26.25 (L) 08/12/2017  ? ?Lab Results   ?Component Value Date  ? WBC 10.7 (H) 10/29/2020  ? HGB 11.7 (L) 10/29/2020  ? HCT 35.9 (L) 10/29/2020  ? MCV 91.6 10/29/2020  ? PLT 325 10/29/2020  ? ?No results found for: IRON, TIBC, FERRITIN ? ?Attestation Statements:  ? ?Reviewed by clinician on day of visit: allergies, medications, problem list, medical history, surgical history, family history, social history, and previous encounter notes. ? ?I, Jackson Latino, RMA, am acting as Energy manager for Christina Limbo, FNP. ? ?I have reviewed the above documentation for accuracy and completeness, and I agree with the above. Christina Limbo, FNP  ?

## 2021-08-10 ENCOUNTER — Encounter (INDEPENDENT_AMBULATORY_CARE_PROVIDER_SITE_OTHER): Payer: Self-pay | Admitting: Nurse Practitioner

## 2021-08-10 ENCOUNTER — Ambulatory Visit (INDEPENDENT_AMBULATORY_CARE_PROVIDER_SITE_OTHER): Payer: BC Managed Care – PPO | Admitting: Nurse Practitioner

## 2021-08-10 VITALS — BP 132/81 | HR 62 | Temp 97.7°F | Ht 62.0 in | Wt 211.0 lb

## 2021-08-10 DIAGNOSIS — D649 Anemia, unspecified: Secondary | ICD-10-CM

## 2021-08-10 DIAGNOSIS — E785 Hyperlipidemia, unspecified: Secondary | ICD-10-CM | POA: Diagnosis not present

## 2021-08-10 DIAGNOSIS — E1169 Type 2 diabetes mellitus with other specified complication: Secondary | ICD-10-CM

## 2021-08-10 DIAGNOSIS — E669 Obesity, unspecified: Secondary | ICD-10-CM | POA: Diagnosis not present

## 2021-08-10 DIAGNOSIS — Z6838 Body mass index (BMI) 38.0-38.9, adult: Secondary | ICD-10-CM

## 2021-08-10 DIAGNOSIS — E119 Type 2 diabetes mellitus without complications: Secondary | ICD-10-CM

## 2021-08-10 MED ORDER — WEGOVY 0.5 MG/0.5ML ~~LOC~~ SOAJ: 0.5000 mg | SUBCUTANEOUS | 0 refills | Status: DC

## 2021-08-11 LAB — ANEMIA PANEL
Ferritin: 96 ng/mL (ref 15–150)
Folate, Hemolysate: 430 ng/mL
Folate, RBC: 1080 ng/mL (ref >498)
Hematocrit: 39.8 % (ref 34.0–46.6)
Iron Saturation: 16 % (ref 15–55)
Iron: 47 ug/dL (ref 27–159)
Retic Ct Pct: 1.3 % (ref 0.6–2.6)
Total Iron Binding Capacity: 288 ug/dL (ref 250–450)
UIBC: 241 ug/dL (ref 131–425)
Vitamin B-12: 1173 pg/mL (ref 232–1245)

## 2021-08-11 LAB — INSULIN, RANDOM: INSULIN: 12.9 u[IU]/mL (ref 2.6–24.9)

## 2021-08-11 LAB — HEMOGLOBIN A1C
Est. average glucose Bld gHb Est-mCnc: 131 mg/dL
Hgb A1c MFr Bld: 6.2 % — ABNORMAL HIGH (ref 4.8–5.6)

## 2021-08-11 LAB — COMPREHENSIVE METABOLIC PANEL
ALT: 17 IU/L (ref 0–32)
AST: 22 IU/L (ref 0–40)
Albumin/Globulin Ratio: 1.2 (ref 1.2–2.2)
Albumin: 4.3 g/dL (ref 3.8–4.9)
Alkaline Phosphatase: 96 IU/L (ref 44–121)
BUN/Creatinine Ratio: 17 (ref 9–23)
BUN: 14 mg/dL (ref 6–24)
Bilirubin Total: 0.4 mg/dL (ref 0.0–1.2)
CO2: 20 mmol/L (ref 20–29)
Calcium: 9.4 mg/dL (ref 8.7–10.2)
Chloride: 105 mmol/L (ref 96–106)
Creatinine, Ser: 0.83 mg/dL (ref 0.57–1.00)
Globulin, Total: 3.5 g/dL (ref 1.5–4.5)
Glucose: 91 mg/dL (ref 70–99)
Potassium: 4.5 mmol/L (ref 3.5–5.2)
Sodium: 142 mmol/L (ref 134–144)
Total Protein: 7.8 g/dL (ref 6.0–8.5)
eGFR: 84 mL/min/{1.73_m2} (ref >59)

## 2021-08-11 LAB — LIPID PANEL WITH LDL/HDL RATIO
Cholesterol, Total: 216 mg/dL — ABNORMAL HIGH (ref 100–199)
HDL: 49 mg/dL (ref >39)
LDL Chol Calc (NIH): 152 mg/dL — ABNORMAL HIGH (ref 0–99)
LDL/HDL Ratio: 3.1 ratio (ref 0.0–3.2)
Triglycerides: 86 mg/dL (ref 0–149)
VLDL Cholesterol Cal: 15 mg/dL (ref 5–40)

## 2021-08-14 NOTE — Progress Notes (Signed)
Chief Complaint:   OBESITY Christina Poole is here to discuss her progress with her obesity treatment plan along with follow-up of her obesity related diagnoses. Christina Poole is on the Category 2 Plan and states she is following her eating plan approximately 40% of the time. Christina Poole states she is walking 30 minutes 4 times per week.  Today's visit was #: 8 Starting weight: 215 lbs Starting date: 03/07/2021 Today's weight: 211 lbs Today's date: 08/10/2021 Total lbs lost to date: 4 lbs Total lbs lost since last in-office visit: 2  Interim History: Christina Poole is doing well with weight loss since her last visit. She is taking Wegovy 0.25 mg and denies side effects. She is not skipping meals. Breakfast: coffee and protein shake, lunch: protein and veggies, dinner: dinner and veggies, snacks: fruit. She is working on increasing her water intake, also drinks hot tea. She is going to [place] this week. Christina Poole voices still some hunger.  Subjective:   1. Hyperlipidemia associated with type 2 diabetes mellitus (HCC) Christina Poole has never been on medications.  2. Type 2 diabetes mellitus without complication, without long-term current use of insulin (HCC) Christina Poole las A1c was 6.3.  3. Anemia, unspecified type Christina Poole had colonoscopy 2/23, she is not currently taking iron, as she did years ago. She is unsure of her last menses- at least 5 years ago. Denies any blood in stool.  Assessment/Plan:   1. Hyperlipidemia associated with type 2 diabetes mellitus (HCC) We will obtain labs today. Cardiovascular risk and specific lipid/LDL goals reviewed.  We discussed several lifestyle modifications today and Christina Poole will continue to work on diet, exercise and weight loss efforts. Orders and follow up as documented in patient record.   Counseling Intensive lifestyle modifications are the first line treatment for this issue. Dietary changes: Increase soluble fiber. Decrease simple carbohydrates. Exercise changes: Moderate to  vigorous-intensity aerobic activity 150 minutes per week if tolerated. Lipid-lowering medications: see documented in medical record.  - Comprehensive metabolic panel - Lipid Panel With LDL/HDL Ratio  2. Type 2 diabetes mellitus without complication, without long-term current use of insulin (HCC) We will obtain labs today. Good blood sugar control is important to decrease the likelihood of diabetic complications such as nephropathy, neuropathy, limb loss, blindness, coronary artery disease, and death. Intensive lifestyle modification including diet, exercise and weight loss are the first line of treatment for diabetes.   - Comprehensive metabolic panel - Hemoglobin A1c - Insulin, random  3. Anemia, unspecified type We will obtain labs today.  - Comprehensive metabolic panel - Anemia panel  4. Obesity, current BMI 38.6 We will Increase Wegovy to 0.5 mg SubQ once weekly. Fill for 1 month with 0 refills.  -Increase  Semaglutide-Weight Management (WEGOVY) 0.5 MG/0.5ML SOAJ; Inject 0.5 mg into the skin once a week.  Dispense: 2 mL; Refill: 0  We will obtain labs today. - Comprehensive metabolic panel  Kenzly is currently in the action stage of change. As such, her goal is to continue with weight loss efforts. She has agreed to the Category 2 Plan.   Exercise goals: As is.  Behavioral modification strategies: increasing lean protein intake, increasing water intake, and planning for success.  Christina Poole has agreed to follow-up with our clinic in 3 weeks. She was informed of the importance of frequent follow-up visits to maximize her success with intensive lifestyle modifications for her multiple health conditions.   Christina Poole was informed we would discuss her lab results at her next visit unless there is a critical  issue that needs to be addressed sooner. Christina Poole agreed to keep her next visit at the agreed upon time to discuss these results.  Objective:   Blood pressure 132/81, pulse 62,  temperature 97.7 F (36.5 C), height 5\' 2"  (1.575 m), weight 211 lb (95.7 kg), SpO2 97 %. Body mass index is 38.59 kg/m.  General: Cooperative, alert, well developed, in no acute distress. HEENT: Conjunctivae and lids unremarkable. Cardiovascular: Regular rhythm.  Lungs: Normal work of breathing. Neurologic: No focal deficits.   Lab Results  Component Value Date   CREATININE 0.83 08/10/2021   BUN 14 08/10/2021   NA 142 08/10/2021   K 4.5 08/10/2021   CL 105 08/10/2021   CO2 20 08/10/2021   Lab Results  Component Value Date   ALT 17 08/10/2021   AST 22 08/10/2021   ALKPHOS 96 08/10/2021   BILITOT 0.4 08/10/2021   Lab Results  Component Value Date   HGBA1C 6.2 (H) 08/10/2021   HGBA1C 6.3 (H) 03/07/2021   HGBA1C 6.6 (H) 08/31/2020   Lab Results  Component Value Date   INSULIN 12.9 08/10/2021   INSULIN 13.0 03/07/2021   Lab Results  Component Value Date   TSH 1.030 03/07/2021   Lab Results  Component Value Date   CHOL 216 (H) 08/10/2021   HDL 49 08/10/2021   LDLCALC 152 (H) 08/10/2021   TRIG 86 08/10/2021   CHOLHDL 4 09/18/2016   Lab Results  Component Value Date   VD25OH 41.4 03/07/2021   VD25OH 26.25 (L) 08/12/2017   Lab Results  Component Value Date   WBC 10.7 (H) 10/29/2020   HGB 11.7 (L) 10/29/2020   HCT 39.8 08/10/2021   MCV 91.6 10/29/2020   PLT 325 10/29/2020   Lab Results  Component Value Date   IRON 47 08/10/2021   TIBC 288 08/10/2021   FERRITIN 96 08/10/2021   Attestation Statements:   Reviewed by clinician on day of visit: allergies, medications, problem list, medical history, surgical history, family history, social history, and previous encounter notes.  I, Brendell Tyus, RMA, am acting as transcriptionist for 08/12/2021, FNP..  I have reviewed the above documentation for accuracy and completeness, and I agree with the above. Irene Limbo, FNP

## 2021-08-31 ENCOUNTER — Encounter (INDEPENDENT_AMBULATORY_CARE_PROVIDER_SITE_OTHER): Payer: Self-pay | Admitting: Family Medicine

## 2021-08-31 ENCOUNTER — Ambulatory Visit (INDEPENDENT_AMBULATORY_CARE_PROVIDER_SITE_OTHER): Payer: BC Managed Care – PPO | Admitting: Family Medicine

## 2021-08-31 ENCOUNTER — Ambulatory Visit (INDEPENDENT_AMBULATORY_CARE_PROVIDER_SITE_OTHER): Payer: BC Managed Care – PPO | Admitting: Nurse Practitioner

## 2021-08-31 VITALS — BP 127/4 | HR 72 | Temp 98.0°F | Ht 62.0 in | Wt 217.0 lb

## 2021-08-31 DIAGNOSIS — Z6838 Body mass index (BMI) 38.0-38.9, adult: Secondary | ICD-10-CM | POA: Diagnosis not present

## 2021-08-31 DIAGNOSIS — E1169 Type 2 diabetes mellitus with other specified complication: Secondary | ICD-10-CM | POA: Diagnosis not present

## 2021-08-31 DIAGNOSIS — E119 Type 2 diabetes mellitus without complications: Secondary | ICD-10-CM

## 2021-08-31 DIAGNOSIS — E785 Hyperlipidemia, unspecified: Secondary | ICD-10-CM

## 2021-08-31 DIAGNOSIS — E669 Obesity, unspecified: Secondary | ICD-10-CM

## 2021-08-31 DIAGNOSIS — Z7985 Long-term (current) use of injectable non-insulin antidiabetic drugs: Secondary | ICD-10-CM

## 2021-08-31 MED ORDER — WEGOVY 0.5 MG/0.5ML ~~LOC~~ SOAJ: 0.5000 mg | SUBCUTANEOUS | 0 refills | Status: DC

## 2021-09-02 DIAGNOSIS — E119 Type 2 diabetes mellitus without complications: Secondary | ICD-10-CM | POA: Insufficient documentation

## 2021-09-02 DIAGNOSIS — E1169 Type 2 diabetes mellitus with other specified complication: Secondary | ICD-10-CM | POA: Insufficient documentation

## 2021-09-02 NOTE — Progress Notes (Unsigned)
Chief Complaint:   OBESITY Christina Poole is here to discuss her progress with her obesity treatment plan along with follow-up of her obesity related diagnoses. Christina Poole is on the Category 2 Plan and states she is following her eating plan approximately 20% of the time. Christina Poole states she is walking or taking a class at the [facility] 30-40 minutes 5 times per week.  Today's visit was #: 9 Starting weight: 215 lbs Starting date: 03/07/2021 Today's weight: 217 lbs Today's date: 08/31/2021 Total lbs lost to date: 0 Total lbs lost since last in-office visit: 0  Interim History: 1st office visit with me.  She had been on vacation for 3 weeks and recently was unable to follow the meal plan.  She has been working out 5 days per week.   Subjective:   1. Type 2 diabetes mellitus without complication, without long-term current use of insulin (HCC) Discussed labs with patient today. Christina Poole endorses that she is not a diabetic  and has never been told she has diabetes.  About 1.5 years ago, Christina Poole A1c with PCP was at 6.6.  2. Hyperlipidemia associated with type 2 diabetes mellitus (HCC) Discussed labs with patient today.Medication(s) reviewed. Patient denies myalgias. Not taking any medications currently. LDL above goal.   The 10-year ASCVD risk score (Arnett DK, et al., 2019) is: 12.5%   Values used to calculate the score:     Age: 56 years     Sex: Female     Is Non-Hispanic African American: Yes     Diabetic: Yes     Tobacco smoker: No     Systolic Blood Pressure: 127 mmHg     Is BP treated: Yes     HDL Cholesterol: 49 mg/dL     Total Cholesterol: 216 mg/dL  Assessment/Plan:  No orders of the defined types were placed in this encounter.   Medications Discontinued During This Encounter  Medication Reason   Semaglutide-Weight Management (WEGOVY) 0.5 MG/0.5ML SOAJ Reorder     Meds ordered this encounter  Medications   Semaglutide-Weight Management (WEGOVY) 0.5 MG/0.5ML SOAJ    Sig:  Inject 0.5 mg into the skin once a week.    Dispense:  2 mL    Refill:  0     1. Type 2 diabetes mellitus without complication, without long-term current use of insulin (HCC) Good blood sugar control is important to decrease the likelihood of diabetic complications such as nephropathy, neuropathy, limb loss, blindness, coronary artery disease, and death. Intensive lifestyle modification including diet, exercise and weight loss are the first line of treatment for diabetes. Extensive counseling done with Christina Poole, for her diabetes mellitus diagnosis  and disease.  Treatment and exercise and continue prudent nutritional plan and weight loss. Her GLP-1 reviewed with her.  Continue current treatment plan.   2. Hyperlipidemia associated with type 2 diabetes mellitus (HCC) Cardiovascular risk and specific lipid/LDL goals reviewed.  We discussed several lifestyle modifications today and Christina Poole will continue to work on diet, exercise and weight loss efforts. Orders and follow up as documented in patient record.   Counseling Intensive lifestyle modifications are the first line treatment for this issue. Dietary changes: Increase soluble fiber. Decrease simple carbohydrates. Exercise changes: Moderate to vigorous-intensity aerobic activity 150 minutes per week if tolerated. Lipid-lowering medications: see documented in medical record. LDL 152 and not at goal.  LDL 152 and not at goal <70. I recommended Christina Poole to follow up with PCP about statin treatment of this chronic disease.   3.  Obesity, current BMI 38.6 Discussed labs with Christina Poole today. Computer crashed in the middle of the office visit.  I was unable to review all of her labs today.  These will need to be reviewed at next office visit.  She was also given a new handout for the Category 2. Christina Poole was increased to 0.5 mg at last office visit with Christina Poole.  She could not find the medicine anywhere due to national shortage and has been out since last  office visit on 08/10/2021.    Refill - Semaglutide-Weight Management (WEGOVY) 0.5 MG/0.5ML SOAJ; Inject 0.5 mg into the skin once a week.  Dispense: 2 mL; Refill: 0  Christina Poole is currently in the action stage of change. As such, her goal is to continue with weight loss efforts. She has agreed to the Category 2 Plan.   Exercise goals:  As is.   Behavioral modification strategies: increasing lean protein intake, decreasing simple carbohydrates, meal planning and cooking strategies, and planning for success.  Christina Poole has agreed to follow-up with our clinic in 3 weeks. She was informed of the importance of frequent follow-up visits to maximize her success with intensive lifestyle modifications for her multiple health conditions.   Objective:   Blood pressure (!) 127/4, pulse 72, temperature 98 F (36.7 C), height 5\' 2"  (1.575 m), weight 217 lb (98.4 kg), SpO2 98 %. Body mass index is 39.69 kg/m.  General: Cooperative, alert, well developed, in no acute distress. HEENT: Conjunctivae and lids unremarkable. Cardiovascular: Regular rhythm.  Lungs: Normal work of breathing. Neurologic: No focal deficits.   Lab Results  Component Value Date   CREATININE 0.83 08/10/2021   BUN 14 08/10/2021   NA 142 08/10/2021   K 4.5 08/10/2021   CL 105 08/10/2021   CO2 20 08/10/2021   Lab Results  Component Value Date   ALT 17 08/10/2021   AST 22 08/10/2021   ALKPHOS 96 08/10/2021   BILITOT 0.4 08/10/2021   Lab Results  Component Value Date   HGBA1C 6.2 (H) 08/10/2021   HGBA1C 6.3 (H) 03/07/2021   HGBA1C 6.6 (H) 08/31/2020   Lab Results  Component Value Date   INSULIN 12.9 08/10/2021   INSULIN 13.0 03/07/2021   Lab Results  Component Value Date   TSH 1.030 03/07/2021   Lab Results  Component Value Date   CHOL 216 (H) 08/10/2021   HDL 49 08/10/2021   LDLCALC 152 (H) 08/10/2021   TRIG 86 08/10/2021   CHOLHDL 4 09/18/2016   Lab Results  Component Value Date   VD25OH 41.4 03/07/2021    VD25OH 26.25 (L) 08/12/2017   Lab Results  Component Value Date   WBC 10.7 (H) 10/29/2020   HGB 11.7 (L) 10/29/2020   HCT 39.8 08/10/2021   MCV 91.6 10/29/2020   PLT 325 10/29/2020   Lab Results  Component Value Date   IRON 47 08/10/2021   TIBC 288 08/10/2021   FERRITIN 96 08/10/2021    Attestation Statements:   Reviewed by clinician on day of visit: allergies, medications, problem list, medical history, surgical history, family history, social history, and previous encounter notes.  I, 08/12/2021, RMA, am acting as [place] for Energy manager, DO.   I have reviewed the above documentation for accuracy and completeness, and I agree with the above. [employer], D.O.  The 21st Century Cures Act was signed into law in 2016 which includes the topic of electronic health records.  This provides immediate access to information in MyChart.  This  includes consultation notes, operative notes, office notes, lab results and pathology reports.  If you have any questions about what you read please let us know at your next visit so we can discuss your concerns and take corrective action if need be.  We are right here with you.

## 2021-09-19 LAB — BASIC METABOLIC PANEL
BUN: 17 (ref 4–21)
CO2: 28 — AB (ref 13–22)
Chloride: 109 — AB (ref 99–108)
Creatinine: 1 (ref 0.5–1.1)
Glucose: 112
Potassium: 3.9 mEq/L (ref 3.5–5.1)
Sodium: 141 (ref 137–147)

## 2021-09-19 LAB — CBC: RBC: 3.96 (ref 3.87–5.11)

## 2021-09-19 LAB — CBC AND DIFFERENTIAL
HCT: 35 — AB (ref 36–46)
Hemoglobin: 11.4 — AB (ref 12.0–16.0)
Platelets: 264 10*3/uL (ref 150–400)
WBC: 11.5

## 2021-09-19 LAB — COMPREHENSIVE METABOLIC PANEL
Albumin: 3.6 (ref 3.5–5.0)
Calcium: 9 (ref 8.7–10.7)
eGFR: 67

## 2021-09-19 LAB — HEPATIC FUNCTION PANEL
ALT: 16 U/L (ref 7–35)
AST: 20 (ref 13–35)
Alkaline Phosphatase: 54 (ref 25–125)
Bilirubin, Total: 0.5

## 2021-09-21 ENCOUNTER — Ambulatory Visit (INDEPENDENT_AMBULATORY_CARE_PROVIDER_SITE_OTHER): Payer: BC Managed Care – PPO | Admitting: Nurse Practitioner

## 2021-09-21 ENCOUNTER — Encounter (INDEPENDENT_AMBULATORY_CARE_PROVIDER_SITE_OTHER): Payer: Self-pay | Admitting: Nurse Practitioner

## 2021-09-21 VITALS — BP 119/73 | HR 83 | Temp 98.1°F | Ht 62.0 in | Wt 214.0 lb

## 2021-09-21 DIAGNOSIS — I10 Essential (primary) hypertension: Secondary | ICD-10-CM | POA: Diagnosis not present

## 2021-09-21 DIAGNOSIS — Z6839 Body mass index (BMI) 39.0-39.9, adult: Secondary | ICD-10-CM | POA: Diagnosis not present

## 2021-09-21 DIAGNOSIS — E669 Obesity, unspecified: Secondary | ICD-10-CM

## 2021-09-26 NOTE — Progress Notes (Signed)
Chief Complaint:   OBESITY Christina Poole is here to discuss her progress with her obesity treatment plan along with follow-up of her obesity related diagnoses. Christina Poole is on the Category 2 Plan and states she is following her eating plan approximately 50% of the time. [name] states she is walking and gym classes 45 minutes 4 times per week.  Today's visit was #: 10 Starting weight: 215 lbs Starting date: 03/07/2021 Today's weight: 214 lbs Today's date: 09/21/2021 Total lbs lost to date: 1 lb Total lbs lost since last in-office visit: 3  Interim History: Christina Poole has done well with weight loss since her last visit. She is substituting a protein broth for breakfast. Has not taken [facility] in over a month due to shortage. Eating 3 meals per day, no snacks. Aiming to get a protein in with each meal. She does not weigh or measure foods. She is trying to drink more water.  Subjective:   1. Essential hypertension [name] is currently taking Lotrel 5-20 mg. Denies any chest pain,shortness of breath or palpitations.  Assessment/Plan:   1. Essential hypertension Christina Poole will continue to follow up with PCP and continue medications as directed. [name] is working on healthy weight loss and exercise to improve blood pressure control. We will watch for signs of hypotension as she continues her lifestyle modifications.  2. Obesity, current BMI 39.1 Christina Poole is currently in the action stage of change. As such, her goal is to continue with weight loss efforts. She has agreed to the Category 2 Plan.   We discussed Saxenda. She is not interested at this time. She would like to wait until Christina Poole is available.   Exercise goals: As is.  Behavioral modification strategies: increasing lean protein intake, increasing water intake, and planning for success.  Christina Poole has agreed to follow-up with our clinic in 3 weeks. She was informed of the importance of frequent follow-up visits to maximize her success with intensive  lifestyle modifications for her multiple health conditions.   Objective:   Blood pressure 119/73, pulse 83, temperature 98.1 F (36.7 C), height 5\' 2"  (1.575 m), weight 214 lb (97.1 kg), SpO2 100 %. Body mass index is 39.14 kg/m.  General: Cooperative, alert, well developed, in no acute distress. HEENT: Conjunctivae and lids unremarkable. Cardiovascular: Regular rhythm.  Lungs: Normal work of breathing. Neurologic: No focal deficits.   Lab Results  Component Value Date   CREATININE 0.83 08/10/2021   BUN 14 08/10/2021   NA 142 08/10/2021   K 4.5 08/10/2021   CL 105 08/10/2021   CO2 20 08/10/2021   Lab Results  Component Value Date   ALT 17 08/10/2021   AST 22 08/10/2021   ALKPHOS 96 08/10/2021   BILITOT 0.4 08/10/2021   Lab Results  Component Value Date   HGBA1C 6.2 (H) 08/10/2021   HGBA1C 6.3 (H) 03/07/2021   HGBA1C 6.6 (H) 08/31/2020   Lab Results  Component Value Date   INSULIN 12.9 08/10/2021   INSULIN 13.0 03/07/2021   Lab Results  Component Value Date   TSH 1.030 03/07/2021   Lab Results  Component Value Date   CHOL 216 (H) 08/10/2021   HDL 49 08/10/2021   LDLCALC 152 (H) 08/10/2021   TRIG 86 08/10/2021   CHOLHDL 4 09/18/2016   Lab Results  Component Value Date   VD25OH 41.4 03/07/2021   VD25OH 26.25 (L) 08/12/2017   Lab Results  Component Value Date   WBC 10.7 (H) 10/29/2020   HGB 11.7 (L) 10/29/2020  HCT 39.8 08/10/2021   MCV 91.6 10/29/2020   PLT 325 10/29/2020   Lab Results  Component Value Date   IRON 47 08/10/2021   TIBC 288 08/10/2021   FERRITIN 96 08/10/2021   Attestation Statements:   Reviewed by clinician on day of visit: allergies, medications, problem list, medical history, surgical history, family history, social history, and previous encounter notes.  Time spent on visit including pre-visit chart review and post-visit care and charting was 30 minutes.   I, Brendell Tyus, RMA, am acting as transcriptionist for Irene Limbo, FNP.  I have reviewed the above documentation for accuracy and completeness, and I agree with the above. Irene Limbo, FNP

## 2021-10-04 ENCOUNTER — Encounter (INDEPENDENT_AMBULATORY_CARE_PROVIDER_SITE_OTHER): Payer: Self-pay

## 2021-10-05 ENCOUNTER — Encounter: Payer: Self-pay | Admitting: Physician Assistant

## 2021-10-11 ENCOUNTER — Ambulatory Visit (INDEPENDENT_AMBULATORY_CARE_PROVIDER_SITE_OTHER): Payer: BC Managed Care – PPO | Admitting: Bariatrics

## 2021-10-11 ENCOUNTER — Other Ambulatory Visit (HOSPITAL_COMMUNITY): Payer: Self-pay

## 2021-10-11 ENCOUNTER — Encounter (INDEPENDENT_AMBULATORY_CARE_PROVIDER_SITE_OTHER): Payer: Self-pay | Admitting: Bariatrics

## 2021-10-11 VITALS — BP 112/79 | HR 64 | Temp 97.5°F | Ht 62.0 in | Wt 219.0 lb

## 2021-10-11 DIAGNOSIS — E785 Hyperlipidemia, unspecified: Secondary | ICD-10-CM

## 2021-10-11 DIAGNOSIS — E1169 Type 2 diabetes mellitus with other specified complication: Secondary | ICD-10-CM | POA: Diagnosis not present

## 2021-10-11 DIAGNOSIS — Z7985 Long-term (current) use of injectable non-insulin antidiabetic drugs: Secondary | ICD-10-CM

## 2021-10-11 DIAGNOSIS — E119 Type 2 diabetes mellitus without complications: Secondary | ICD-10-CM

## 2021-10-11 DIAGNOSIS — E669 Obesity, unspecified: Secondary | ICD-10-CM | POA: Diagnosis not present

## 2021-10-11 DIAGNOSIS — Z6841 Body Mass Index (BMI) 40.0 and over, adult: Secondary | ICD-10-CM | POA: Diagnosis not present

## 2021-10-11 MED ORDER — WEGOVY 0.5 MG/0.5ML ~~LOC~~ SOAJ
0.5000 mg | SUBCUTANEOUS | 0 refills | Status: DC
  Filled 2021-10-11: qty 2, 28d supply, fill #0

## 2021-10-12 ENCOUNTER — Ambulatory Visit (INDEPENDENT_AMBULATORY_CARE_PROVIDER_SITE_OTHER): Payer: BC Managed Care – PPO | Admitting: Bariatrics

## 2021-10-12 ENCOUNTER — Other Ambulatory Visit (HOSPITAL_COMMUNITY): Payer: Self-pay

## 2021-10-21 NOTE — Progress Notes (Unsigned)
Chief Complaint:   OBESITY Christina Poole is here to discuss her progress with her obesity treatment plan along with follow-up of her obesity related diagnoses. Christina Poole is on the Category 2 Plan and states she is following her eating plan approximately 0% of the time. [facility] 4 times per [facility].  Today's visit was #: 11 Starting weight: 215 lbs Starting date: 03/07/2021 Today's weight: 219 lbs Today's date: 10/11/2021 Total lbs lost to date: 0 Total lbs lost since last in-office visit: 0  Interim History: Christina Poole is up 5 lbs since her last visit. She had some celebrations.   Subjective:   1. Type 2 diabetes mellitus without complication, without long-term current use of insulin (HCC) Christina Poole is taking Wegovy, but she stopped due to shortage in availability.   2. Hyperlipidemia associated with type 2 diabetes mellitus (HCC) Christina Poole is not on medications currently. Her cholesterol level is coming down.   Assessment/Plan:   1. Type 2 diabetes mellitus without complication, without long-term current use of insulin (HCC) Christina Poole will continue Wegovy 0.5 mg once weekly.   2. Hyperlipidemia associated with type 2 diabetes mellitus (HCC) Christina Poole declines statin at this time.   3. Obesity, Current BMI 40.1 Christina Poole is currently in the action stage of change. As such, her goal is to continue with weight loss efforts. She has agreed to the Category 2 Plan and keeping a food journal and adhering to recommended goals of 1200 calories and 80 grams of protein daily.   Meal planning and intentional eating were discussed. She will adhere to the plan and will increase her protein.   We discussed various medication options to help Christina Poole with her weight loss efforts and we both agreed to continue Wegovy 0.5 mg once weekly, and we will refill for 1 month.  - Semaglutide-Weight Management (WEGOVY) 0.5 MG/0.5ML SOAJ; Inject 0.5 mg into the skin once a  week.  Dispense: 2 mL; Refill: 0  Exercise goals: As is.   Behavioral modification strategies: increasing lean protein intake, decreasing simple carbohydrates, increasing vegetables, increasing water intake, decreasing eating out, no skipping meals, meal planning and cooking strategies, keeping healthy foods in the home, and planning for success.  Christina Poole has agreed to follow-up with our clinic in 4 weeks with Dr. Sharee Holster. She was informed of the importance of frequent follow-up visits to maximize her success with intensive lifestyle modifications for her multiple health conditions.   Objective:   Blood pressure 112/79, pulse 64, temperature (!) 97.5 F (36.4 C), height 5\' 2"  (1.575 m), weight 219 lb (99.3 kg), SpO2 98 %. Body mass index is 40.06 kg/m.  General: Cooperative, alert, well developed, in no acute distress. HEENT: Conjunctivae and lids unremarkable. Cardiovascular: Regular rhythm.  Lungs: Normal work of breathing. Neurologic: No focal deficits.   Lab Results  Component Value Date   CREATININE 1.0 09/19/2021   BUN 17 09/19/2021   NA 141 09/19/2021   K 3.9 09/19/2021   CL 109 (A) 09/19/2021   CO2 28 (A) 09/19/2021   Lab Results  Component Value Date   ALT 16 09/19/2021   AST 20 09/19/2021   ALKPHOS 54 09/19/2021   BILITOT 0.4 08/10/2021   Lab Results  Component Value Date   HGBA1C 6.2 (H) 08/10/2021   HGBA1C 6.3 (H) 03/07/2021   HGBA1C 6.6 (H) 08/31/2020   Lab Results  Component Value Date   INSULIN 12.9 08/10/2021   INSULIN 13.0  03/07/2021   Lab Results  Component Value Date   TSH 1.030 03/07/2021   Lab Results  Component Value Date   CHOL 216 (H) 08/10/2021   HDL 49 08/10/2021   LDLCALC 152 (H) 08/10/2021   TRIG 86 08/10/2021   CHOLHDL 4 09/18/2016   Lab Results  Component Value Date   VD25OH 41.4 03/07/2021   VD25OH 26.25 (L) 08/12/2017   Lab Results  Component Value Date   WBC 11.5 09/19/2021   HGB 11.4 (A) 09/19/2021   HCT 35 (A)  09/19/2021   MCV 91.6 10/29/2020   PLT 264 09/19/2021   Lab Results  Component Value Date   IRON 47 08/10/2021   TIBC 288 08/10/2021   FERRITIN 96 08/10/2021   Attestation Statements:   Reviewed by clinician on day of visit: allergies, medications, problem list, medical history, surgical history, family history, social history, and previous encounter notes.   Trude Mcburney, am acting as Energy manager for [employer], DO.  I have reviewed the above documentation for accuracy and completeness, and I agree with the above. Corinna Capra, DO

## 2021-10-23 ENCOUNTER — Encounter (INDEPENDENT_AMBULATORY_CARE_PROVIDER_SITE_OTHER): Payer: Self-pay | Admitting: Bariatrics

## 2021-11-09 ENCOUNTER — Ambulatory Visit (INDEPENDENT_AMBULATORY_CARE_PROVIDER_SITE_OTHER): Payer: BC Managed Care – PPO | Admitting: Family Medicine

## 2021-11-09 ENCOUNTER — Encounter (INDEPENDENT_AMBULATORY_CARE_PROVIDER_SITE_OTHER): Payer: Self-pay

## 2021-11-09 IMAGING — CT CT ABD-PELV W/ CM
2 of 5 series · 15 of 46 positions shown, 17 images · IV contrast (APPLIED)
Comparison: None.

CLINICAL DATA: Epigastric pain.

EXAM:
CT ABDOMEN AND PELVIS WITH CONTRAST
TECHNIQUE: Multidetector CT imaging of the abdomen and pelvis was performed
using the standard protocol following bolus administration of
intravenous contrast.
CONTRAST:  75mL OMNIPAQUE IOHEXOL 350 MG/ML SOLN

[Series 2: abd pel w · axial · 0.71mm/px · z∈[-1142,-712]mm · 12 of 98 slices shown, 14 images]
[im 6/98  soft-tissue]
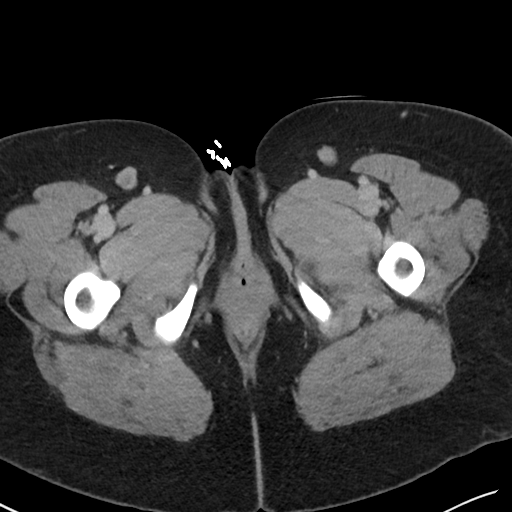
[im 6/98  bone]
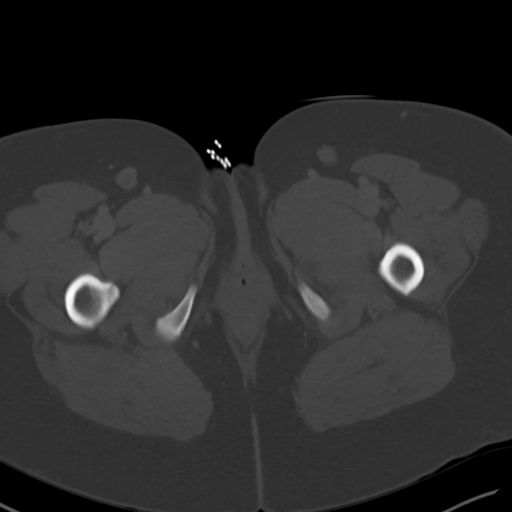
[im 16/98  soft-tissue]
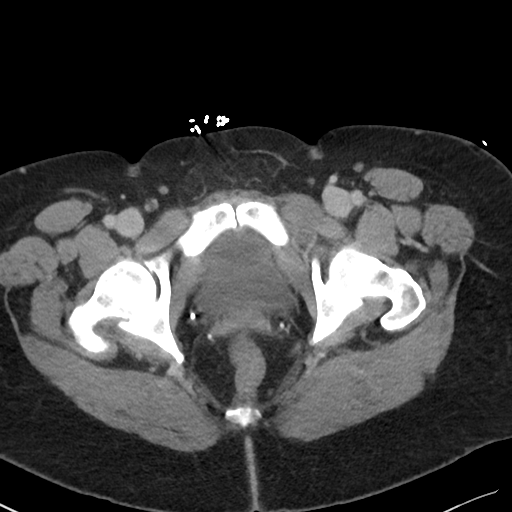
[im 21/98  soft-tissue]
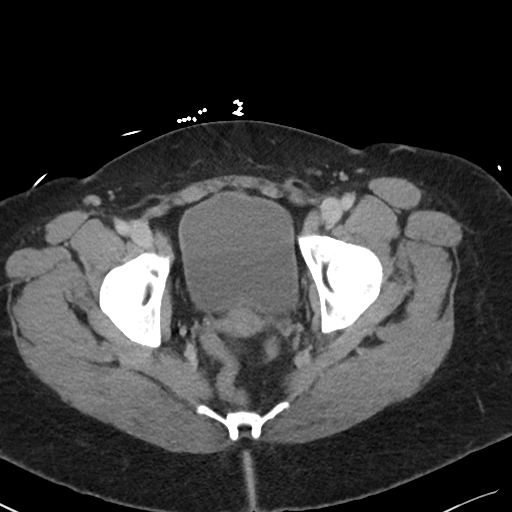
[im 31/98  soft-tissue]
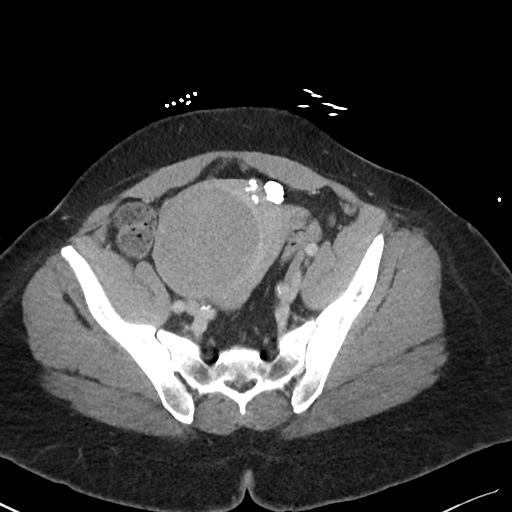
[im 36/98  soft-tissue]
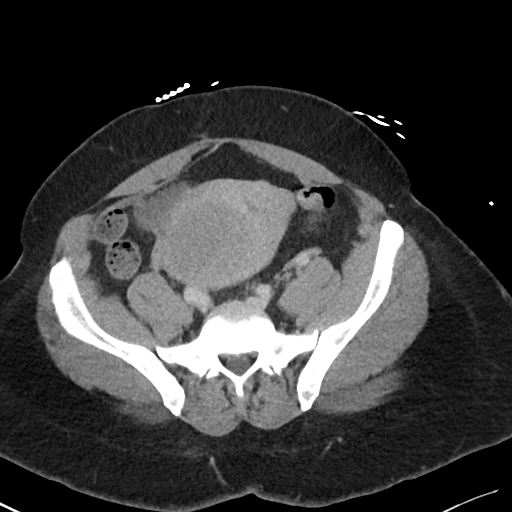
[im 46/98  soft-tissue]
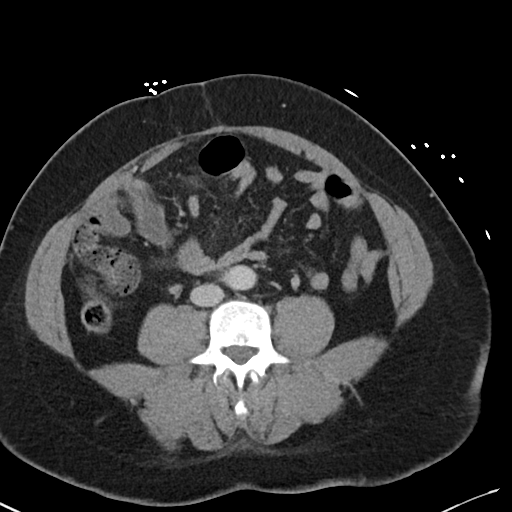
[im 52/98  soft-tissue]
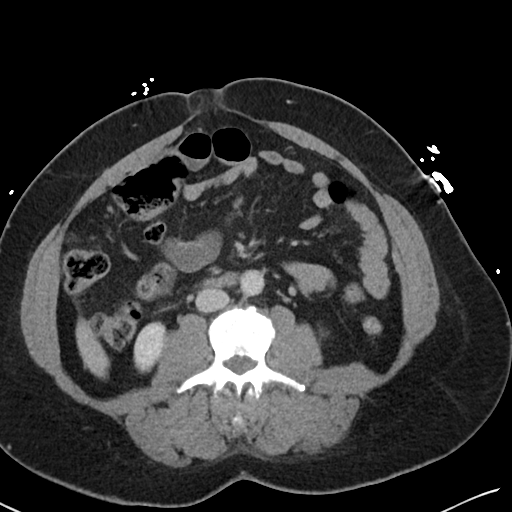
[im 62/98  soft-tissue]
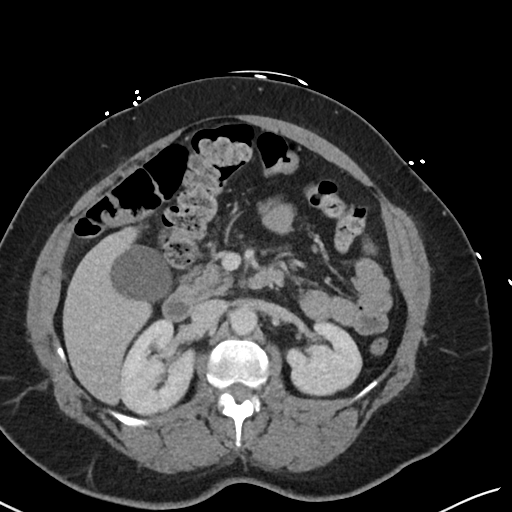
[im 67/98  soft-tissue]
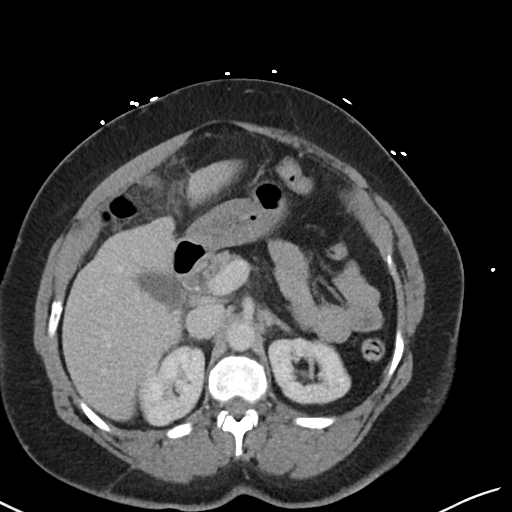
[im 67/98  bone]
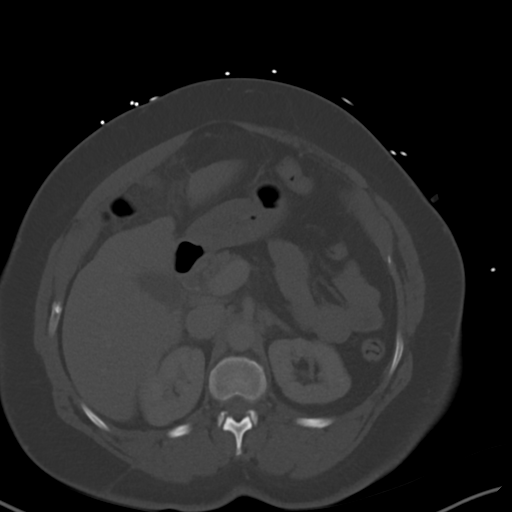
[im 77/98  soft-tissue]
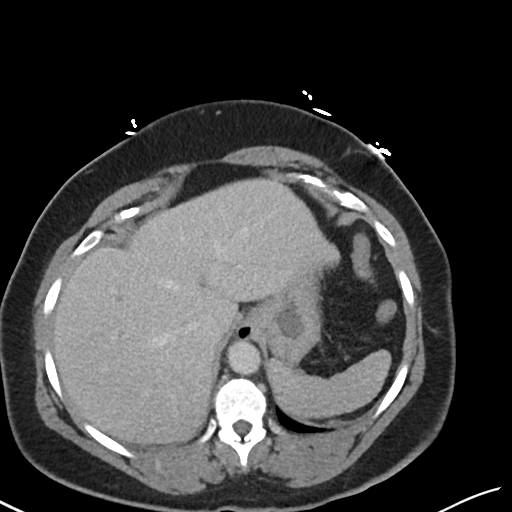
[im 82/98  soft-tissue]
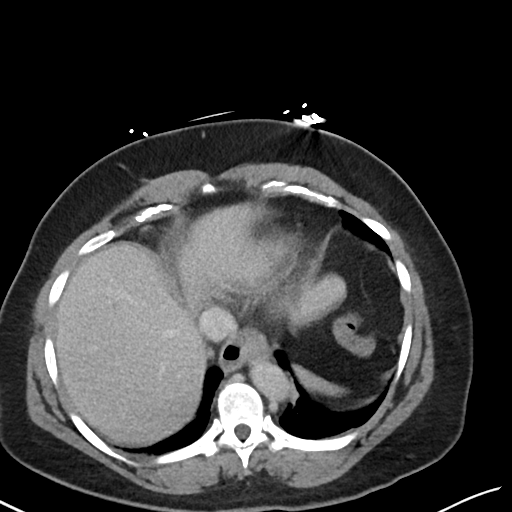
[im 92/98  soft-tissue]
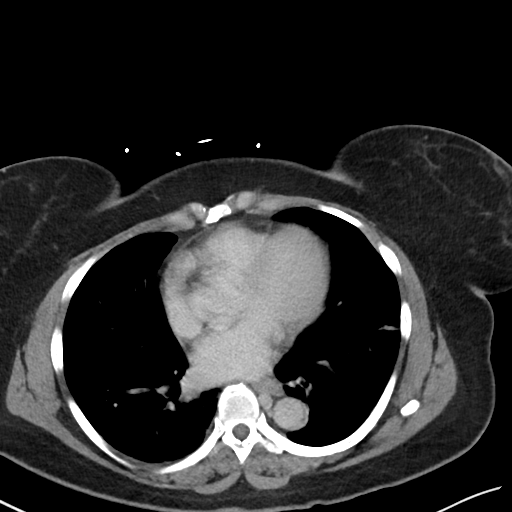

[Series 5: coronal · coronal · 0.79mm/px · 3 of 105 slices shown]
[im 35/105  soft-tissue]
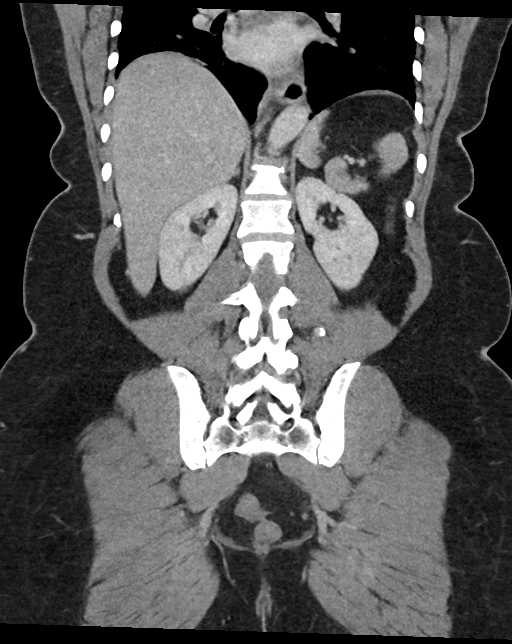
[im 47/105  soft-tissue]
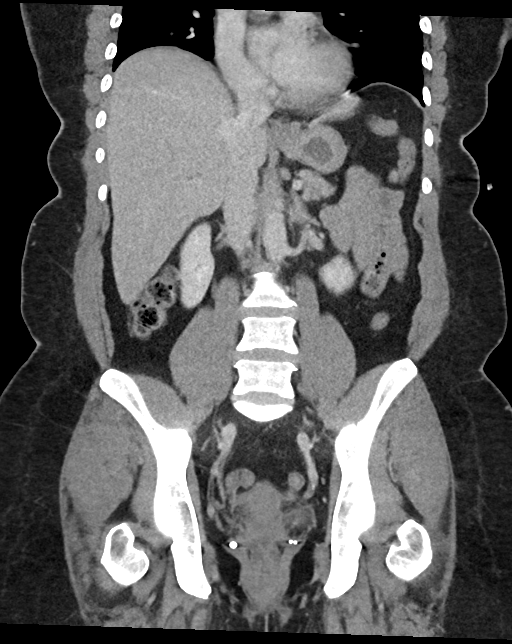
[im 58/105  soft-tissue]
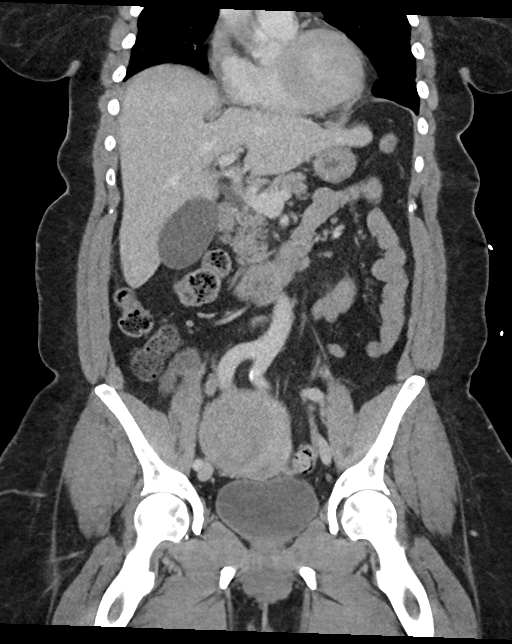

[15 of 46 positions shown; findings below may reference images not displayed]

FINDINGS: Lower chest: Bibasilar linear atelectasis.

No intra-abdominal free air or free fluid.

Hepatobiliary: Slight irregularity of the liver contour may
represent early changes of cirrhosis. Clinical correlation is
recommended. No intrahepatic biliary ductal dilatation. The
gallbladder is unremarkable.

Pancreas: Unremarkable. No pancreatic ductal dilatation or
surrounding inflammatory changes.

Spleen: Normal in size without focal abnormality. A small splenule
at the splenic hilum and tail of the pancreas.

Adrenals/Urinary Tract: The adrenal glands unremarkable. There is no
hydronephrosis on either side. There is symmetric enhancement and
excretion of contrast by both kidneys. The visualized ureters and
urinary bladder appear unremarkable.

Stomach/Bowel: Several normal caliber fecalized loops of small bowel
in the right lower abdomen may represent increased transit time or
small intestinal bacterial overgrowth. There is no bowel obstruction
or active inflammation. The appendix is normal.

Vascular/Lymphatic: Mild aortoiliac atherosclerotic disease. The IVC
is unremarkable. No pain venous gas. There is no adenopathy.

Reproductive: The uterus is anteverted. There is a 7.2 cm right
uterine body fibroid with mass effect and compression of the
endometrium. Several small calcified fibroids noted.

Other: There is stranding and inflammatory changes of the mesentery
in the right upper quadrant anterior and slightly inferior to the
liver. This is of indeterminate etiology but may represent an area
of mesenteric infarct secondary to trauma or twisting. Metastatic
implant is not excluded. Clinical correlation is recommended. No
drainable fluid collection

Musculoskeletal: No acute or significant osseous findings.
IMPRESSION: 1. Inflammatory changes of the mesentery in the right upper quadrant
anterior and slightly inferior to the liver. This may represent an
area of mesenteric infarct secondary to trauma or twisting.
Metastatic implant is not excluded. Clinical correlation is
recommended. No fluid collection.
2. No bowel obstruction. Normal appendix.
3. Large right uterine body fibroid with mass effect and compression
of the endometrium.
4. Aortic Atherosclerosis ([id number]-WOJ.J).

## 2021-11-11 ENCOUNTER — Other Ambulatory Visit (HOSPITAL_COMMUNITY): Payer: Self-pay

## 2021-11-16 ENCOUNTER — Telehealth (INDEPENDENT_AMBULATORY_CARE_PROVIDER_SITE_OTHER): Payer: BC Managed Care – PPO | Admitting: Physician Assistant

## 2021-11-16 ENCOUNTER — Encounter: Payer: Self-pay | Admitting: Physician Assistant

## 2021-11-16 VITALS — Ht 62.0 in | Wt 210.0 lb

## 2021-11-16 DIAGNOSIS — U071 COVID-19: Secondary | ICD-10-CM

## 2021-11-16 MED ORDER — NIRMATRELVIR/RITONAVIR (PAXLOVID)TABLET: 3.0000 | ORAL_TABLET | Freq: Two times a day (BID) | ORAL | 0 refills | Status: DC

## 2021-11-16 NOTE — Progress Notes (Signed)
Virtual Visit via Video   I connected with Christina Poole on 11/16/21 at 11:20 AM EDT by a video enabled telemedicine application and verified that I am speaking with the correct person using two identifiers. Location patient: Home Location provider: [facility], Office Persons participating in the virtual visit: [place], Christina Coke PA-C, Anselmo Pickler, LPN   I discussed the limitations of evaluation and management by telemedicine and the availability of in person appointments. The patient expressed understanding and agreed to proceed.  I acted as a Education administrator for [employer], PA-C [employer], LPN   Subjective:   HPI:   COVID-19 Symptom onset: Monday Travel/contacts: exposed at school  Vaccination status: 3 vaccines Testing results: Monday home test positive 9/18  Patient endorses the following symptoms: Cough, headache, chills off and on, nasal congestion  Patient denies the following symptoms: Nausea, diarrhea, chest pain, shortness of breath  Treatments tried: OTC cold medicine  Patient risk factors: Smoking status: [place]  reports that she has never smoked. She has never used smokeless tobacco. If female, currently pregnant? []   Yes [x]   No  ROS: See pertinent positives and negatives per HPI.  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   Pain of left hand 03/26/2019   Pain in left knee 09/26/2016   Colitis 09/17/2016   Obstructive sleep apnea 09/17/2016   Sarcoidosis 09/07/2016   Hypercalcemia 09/07/2016    Essential hypertension 08/13/2015   LVH (left ventricular hypertrophy) 08/13/2015   Hyperlipidemia 04/02/2015    Social History   Tobacco Use   Smoking status: Never   Smokeless tobacco: Never  Substance Use Topics   Alcohol use: No    Current Outpatient Medications:    amLODipine-benazepril (LOTREL) 5-20 MG capsule, TAKE 1 CAPSULE BY MOUTH EVERY DAY, Disp: 90 capsule, Rfl: 1   cholecalciferol (VITAMIN D) 1000 units tablet, Take 2,000 Units by mouth daily., Disp: , Rfl:    Ginkgo Biloba (GINKOBA PO), Take 120 mg by mouth daily at 12 noon. With Ginseng 200mg , Disp: , Rfl:    nirmatrelvir/ritonavir EUA (PAXLOVID) 20 x 150 MG & 10 x 100MG  TABS, Take 3 tablets by mouth 2 (two) times daily for 5 days. (Take nirmatrelvir 150 mg two tablets twice daily for 5 days and ritonavir 100 mg one tablet twice daily for 5 days), Disp: 30 tablet, Rfl: 0   Omega-3 Fatty Acids (FISH OIL) 1000 MG CAPS, 1 capsule Orally Once a day for 30 day(s), Disp: , Rfl:   Allergies  Allergen Reactions   Codeine     Severe unrelenting nausea    Objective:   VITALS: Per patient if applicable, see vitals. GENERAL: Alert, appears well and in no acute distress. HEENT: Atraumatic, conjunctiva clear, no obvious abnormalities on inspection of external nose and ears. NECK: Normal movements of the head and neck. CARDIOPULMONARY: No increased WOB. Speaking in clear sentences. I:E ratio WNL.  MS: Moves all visible extremities without noticeable abnormality. PSYCH: Pleasant and cooperative, well-groomed. Speech normal rate and rhythm. Affect is appropriate. Insight and judgement are appropriate. Attention is  focused, linear, and appropriate.  NEURO: CN grossly intact. Oriented as arrived to appointment on time with no prompting. Moves both UE equally.  SKIN: No obvious lesions, wounds, erythema, or cyanosis noted on face or hands.  Assessment and Plan:   Christina Poole was seen today for covid positive.  Diagnoses and all  orders for this visit:  COVID-19  Other orders -     nirmatrelvir/ritonavir EUA (PAXLOVID) 20 x 150 MG & 10 x 100MG  TABS; Take 3 tablets by mouth 2 (two) times daily for 5 days. (Take nirmatrelvir 150 mg two tablets twice daily for 5 days and ritonavir 100 mg one tablet twice daily for 5 days)   No red flags on discussion, patient is not in any obvious distress during our visit. Discussed progression of most viral illnesses, and recommended supportive care at this point in time.  We are going to start Paxlovid, side effects, risks benefits discussed. Discussed over the counter supportive care options, including Tylenol 500 mg q 8 hours, with recommendations to push fluids and rest. Reviewed return precautions including new/worsening fever, SOB, new/worsening cough, sudden onset changes of symptoms. Recommended need to self-quarantine and practice social distancing until symptoms resolve. I recommend that patient follow-up if symptoms worsen or persist despite treatment x 7-10 days, sooner if needed.  I discussed the assessment and treatment plan with the patient. The patient was provided an opportunity to ask questions and all were answered. The patient agreed with the plan and demonstrated an understanding of the instructions.   The patient was advised to call back or seek an in-person evaluation if the symptoms worsen or if the condition fails to improve as anticipated.    [place], [place] 11/16/2021

## 2021-11-17 ENCOUNTER — Other Ambulatory Visit: Payer: Self-pay | Admitting: *Deleted

## 2021-11-17 ENCOUNTER — Telehealth: Payer: Self-pay | Admitting: Physician Assistant

## 2021-11-17 MED ORDER — NIRMATRELVIR/RITONAVIR (PAXLOVID)TABLET: 3.0000 | ORAL_TABLET | Freq: Two times a day (BID) | ORAL | 0 refills | Status: AC

## 2021-11-17 NOTE — Telephone Encounter (Signed)
Pt states: -Spouse went to pick up medication and pharmacy did not have it. -pt stated it was z-pack -pt will call pharmacy when they open, then call back if she has any questions/concerns.   Pt acknowledges the correct Rx is Paxlovid Pt acknowledges the e-script system shows receipt by the pharmacy. Pt confirmed [facility] on [address] is correct pharmacy.   No further follow up needed at this time.

## 2021-11-20 ENCOUNTER — Encounter: Payer: Self-pay | Admitting: *Deleted

## 2022-01-28 ENCOUNTER — Other Ambulatory Visit: Payer: Self-pay | Admitting: Physician Assistant

## 2022-02-08 ENCOUNTER — Encounter: Payer: Self-pay | Admitting: *Deleted

## 2022-04-07 ENCOUNTER — Ambulatory Visit
Admission: EM | Admit: 2022-04-07 | Discharge: 2022-04-07 | Disposition: A | Discharge status: Home / Self Care | Payer: BC Managed Care – PPO | Attending: Nurse Practitioner | Admitting: Nurse Practitioner

## 2022-04-07 DIAGNOSIS — J209 Acute bronchitis, unspecified: Secondary | ICD-10-CM | POA: Diagnosis not present

## 2022-04-07 DIAGNOSIS — J019 Acute sinusitis, unspecified: Secondary | ICD-10-CM | POA: Diagnosis not present

## 2022-04-07 MED ORDER — FLUTICASONE PROPIONATE 50 MCG/ACT NA SUSP: 1.0000 | Freq: Every day | NASAL | 0 refills | Status: AC

## 2022-04-07 MED ORDER — AMOXICILLIN-POT CLAVULANATE 875-125 MG PO TABS: 1.0000 | ORAL_TABLET | Freq: Two times a day (BID) | ORAL | 0 refills | Status: DC

## 2022-04-07 NOTE — ED Provider Notes (Signed)
[facility]    CSN: [id number] Arrival date & time: 04/07/22  0910      History   Chief Complaint Chief Complaint  Patient presents with   Cough   Nasal Congestion    HPI Christina Poole is a 57 y.o. female  who presents for evaluation of URI symptoms for 7 days. Patient reports associated symptoms of cough, congestion, sinus pressure/pain. Denies N/V/D, fevers, ear pain, sore throat, body aches, shortness of breath. Patient does not have a hx of asthma or smoking.  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  No recent travel. Pt has taken DayQuil OTC for symptoms. Pt has no other concerns at this time.    Cough   Past Medical History:  Diagnosis Date   Colitis    Edema of both lower extremities    Hypertension    Knee pain    Pericarditis 2007   Sarcoidosis    Sleep apnea    Vitamin D deficiency     Patient Active Problem List   Diagnosis Date Noted   Type 2 diabetes mellitus without complication, without long-term current use of insulin ([place]) 09/02/2021   Hyperlipidemia associated with type 2 diabetes mellitus ([place]) 09/02/2021   Prediabetes 03/21/2021   Class 2 obesity due to excess calories with body mass index (BMI) of 39.0 to 39.9 in adult 03/12/2021   Abdominal pain 10/28/2020   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      Home Medications    Prior to Admission medications   Medication Sig Start Date End Date Taking? Authorizing  Provider  amoxicillin-clavulanate (AUGMENTIN) 875-125 MG tablet Take 1 tablet by mouth every 12 (twelve) hours. 04/07/22  Yes Melynda Ripple, NP  fluticasone (FLONASE) 50 MCG/ACT nasal spray Place 1 spray into both nostrils daily. 04/07/22  Yes Melynda Ripple, NP  amLODipine-benazepril (LOTREL) 5-20 MG capsule TAKE 1 CAPSULE BY MOUTH EVERY DAY 01/29/22   Inda Coke, PA  cholecalciferol (VITAMIN D) 1000 units tablet Take 2,000 Units by mouth daily.    [provider]  Ginkgo Biloba (GINKOBA PO) Take 120 mg by mouth daily at 12 noon. With Ginseng 294m    [provider]  Omega-3 Fatty Acids (FISH OIL) 1000 MG CAPS 1 capsule Orally Once a day for 30 day(s)    [provider]    Family History Family History  Problem Relation Age of Onset   Diabetes Mother    Hypercholesterolemia Mother    Colon cancer Father        in his 633s  High blood pressure Father    Alcoholism Father     Social History Social History   Tobacco Use   Smoking status: Never   Smokeless tobacco: Never  Vaping Use   Vaping Use: Never used  Substance Use Topics   Alcohol use: No   Drug use: No     Allergies   Codeine  Review of Systems Review of Systems  HENT:  Positive for congestion, sinus pressure and sinus pain.   Respiratory:  Positive for cough.      Physical Exam Triage Vital Signs ED Triage Vitals  Enc Vitals Group     BP 04/07/22 0952 134/86     Pulse Rate 04/07/22 0951 93     Resp 04/07/22 0951 18     Temp 04/07/22 0952 98.8 F (37.1 C)     Temp Source 04/07/22 0952 Oral     SpO2 04/07/22 0951 94 %     Weight --      Height --      Head Circumference --      Peak Flow --      Pain Score 04/07/22 0950 6     Pain Loc --      Pain Edu? --      Excl. in [place]? --    No data found.  Updated Vital Signs BP 134/86   Pulse 93   Temp 98.8 F (37.1 C) (Oral)   Resp 18   SpO2 94%   Visual Acuity Right Eye Distance:   Left Eye Distance:   Bilateral  Distance:    Right Eye Near:   Left Eye Near:    Bilateral Near:     Physical Exam Vitals and nursing note reviewed.  Constitutional:      General: She is not in acute distress.    Appearance: She is well-developed. She is not ill-appearing.  HENT:     Head: Normocephalic and atraumatic.     Right Ear: Tympanic membrane and ear canal normal.     Left Ear: Tympanic membrane and ear canal normal.     Nose: Congestion present.     Right Turbinates: Swollen and pale.     Left Turbinates: Swollen and pale.     Mouth/Throat:     Mouth: Mucous membranes are moist.     Pharynx: Oropharynx is clear. Uvula midline. No oropharyngeal exudate or posterior oropharyngeal erythema.     Tonsils: No tonsillar exudate or tonsillar abscesses.  Eyes:     Conjunctiva/sclera: Conjunctivae normal.     Pupils: Pupils are equal, round, and reactive to light.  Cardiovascular:     Rate and Rhythm: Normal rate and regular rhythm.     Heart sounds: Normal heart sounds.  Pulmonary:     Effort: Pulmonary effort is normal.     Breath sounds: Normal breath sounds.  Musculoskeletal:     Cervical back: Normal range of motion and neck supple.  Lymphadenopathy:     Cervical: No cervical adenopathy.  Skin:    General: Skin is warm and dry.  Neurological:     General: No focal deficit present.     Mental Status: She is alert and oriented to person, place, and time.  Psychiatric:        Mood and Affect: Mood normal.        Behavior: Behavior normal.      UC Treatments / Results  Labs (all labs ordered are listed, but only abnormal results are displayed) Labs Reviewed - No data to display  EKG   Radiology No results found.  Procedures Procedures (including critical care time)  Medications Ordered in UC Medications - No data to display  Initial Impression / Assessment and Plan / UC Course  I have reviewed the triage vital signs and the nursing notes.  Pertinent labs & imaging results that  were available during  my care of the patient were reviewed by me and considered in my medical decision making (see chart for details).     Reviewed exam and symptoms with patient.  No red flags on exam. Start Augmentin for sinusitis Flonase daily Nasal rinses as tolerated Patient declined oral prednisone Continue over-the-counter cough medicine as needed Follow-up with PCP in 2 to 3 days for recheck ER precautions reviewed and patient verbalized understanding Final Clinical Impressions(s) / UC Diagnoses   Final diagnoses:  Acute bronchitis, unspecified organism  Acute sinusitis, recurrence not specified, unspecified location     Discharge Instructions      Start Augmentin twice daily for 7 days Flonase daily Nasal rinses as tolerated Over-the-counter cough medicine as needed   ED Prescriptions     Medication Sig Dispense Auth. Provider   amoxicillin-clavulanate (AUGMENTIN) 875-125 MG tablet Take 1 tablet by mouth every 12 (twelve) hours. 14 tablet Melynda Ripple, NP   fluticasone (FLONASE) 50 MCG/ACT nasal spray Place 1 spray into both nostrils daily. 15.8 mL Melynda Ripple, NP      PDMP not reviewed this encounter.   Melynda Ripple, NP 04/07/22 1038

## 2022-04-07 NOTE — Discharge Instructions (Addendum)
Start Augmentin twice daily for 7 days Flonase daily Nasal rinses as tolerated Over-the-counter cough medicine as needed

## 2022-04-07 NOTE — ED Triage Notes (Signed)
Pt presents with c/o a cough and congestion X 1 week. States today she has felt worse. States she has taken DayQuil and OTC medicine.

## 2022-04-11 LAB — HM MAMMOGRAPHY

## 2022-04-13 LAB — RESULTS CONSOLE HPV: CHL HPV: NEGATIVE

## 2022-04-13 LAB — HM PAP SMEAR

## 2022-04-24 NOTE — Progress Notes (Shared)
Christina Poole is a 57 y.o. female here for a new problem.  History of Present Illness:   No chief complaint on file.   HPI  Acute Upper Respiratory Infection Reports experiencing congestion, cough, itchy throat since     Past Medical History:  Diagnosis Date   Colitis    Edema of both lower extremities    Hypertension    Knee pain    Pericarditis 2007   Sarcoidosis    Sleep apnea    Vitamin D deficiency      Social History   Tobacco Use   Smoking status: Never   Smokeless tobacco: Never  Vaping Use   Vaping Use: Never used  Substance Use Topics   Alcohol use: No   Drug use: No    Past Surgical History:  Procedure Laterality Date   APPENDECTOMY     CESAREAN SECTION     MYOMECTOMY      Family History  Problem Relation Age of Onset   Diabetes Mother    Hypercholesterolemia Mother    Colon cancer Father        in his 1s   High blood pressure Father    Alcoholism Father     Allergies  Allergen Reactions   Codeine     Severe unrelenting nausea    Current Medications:   Current Outpatient Medications:    amLODipine-benazepril (LOTREL) 5-20 MG capsule, TAKE 1 CAPSULE BY MOUTH EVERY DAY, Disp: 90 capsule, Rfl: 0   amoxicillin-clavulanate (AUGMENTIN) 875-125 MG tablet, Take 1 tablet by mouth every 12 (twelve) hours., Disp: 14 tablet, Rfl: 0   cholecalciferol (VITAMIN D) 1000 units tablet, Take 2,000 Units by mouth daily., Disp: , Rfl:    fluticasone (FLONASE) 50 MCG/ACT nasal spray, Place 1 spray into both nostrils daily., Disp: 15.8 mL, Rfl: 0   Ginkgo Biloba (GINKOBA PO), Take 120 mg by mouth daily at 12 noon. With Ginseng '200mg'$ , Disp: , Rfl:    Omega-3 Fatty Acids (FISH OIL) 1000 MG CAPS, 1 capsule Orally Once a day for 30 day(s), Disp: , Rfl:    Review of Systems:   ROS  Vitals:   There were no vitals filed for this visit.   There is no height or weight on file to calculate BMI.  Physical Exam:   Physical Exam  Assessment and Plan:    There are no diagnoses linked to this encounter.   Inda Coke, PA-C

## 2022-04-25 ENCOUNTER — Ambulatory Visit: Payer: BC Managed Care – PPO | Admitting: Physician Assistant

## 2022-04-25 ENCOUNTER — Ambulatory Visit: Payer: BC Managed Care – PPO | Admitting: Family Medicine

## 2022-04-25 ENCOUNTER — Encounter: Payer: Self-pay | Admitting: Family Medicine

## 2022-04-25 VITALS — BP 134/76 | HR 80 | Temp 98.0°F | Ht 62.0 in | Wt 236.0 lb

## 2022-04-25 DIAGNOSIS — J302 Other seasonal allergic rhinitis: Secondary | ICD-10-CM

## 2022-04-25 NOTE — Progress Notes (Signed)
[facility] [address] [place] [address] Dept: [phone] Dept Fax: [phone]  Office Visit  Subjective:    Patient ID: Christina Poole, female    DOB: 1965/04/26, 57 y.o..   MRN: [id number]  Chief Complaint  Patient presents with   Cough    C/o having sinus/chest congestion, cough, scratchy throat x 1 week.  Took antibiotics 2 weeks ago.    History of Present Illness:  Patient is in today for with a 1-week history of sinus and chest congestion and a scratchy/itchy throat. She denies fever or GI symptoms. She was seen on 04/07/2022 at [facility] with an upper respiratory illness. she was treated with a course of Augmentin for a sinus infection. She had improvement in her symptoms, but now is sick again. She has a history of allergic rhinitis and is using a steroid nasal spray. She notes she recently went back to using her antihistamine.   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[place]) 10/27/2020   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   Pain of left hand 03/26/2019   Pain in left knee 09/26/2016   Colitis 09/17/2016   Obstructive sleep apnea 09/17/2016   Sarcoidosis 09/07/2016   Hypercalcemia 09/07/2016   Essential hypertension 08/13/2015   LVH (left ventricular hypertrophy) 08/13/2015   Hyperlipidemia 04/02/2015   Past Surgical History:  Procedure Laterality Date   APPENDECTOMY     CESAREAN SECTION     MYOMECTOMY     Family History  Problem Relation Age of Onset   Diabetes Mother    Hypercholesterolemia Mother    Colon  cancer Father        in his 52s   High blood pressure Father    Alcoholism Father    Outpatient Medications Prior to Visit  Medication Sig Dispense Refill   amLODipine-benazepril (LOTREL) 5-20 MG capsule TAKE 1 CAPSULE BY MOUTH EVERY DAY 90 capsule 0   cholecalciferol (VITAMIN D) 1000 units tablet Take 2,000 Units by mouth daily.     fluticasone (FLONASE) 50 MCG/ACT nasal spray Place 1 spray into both nostrils daily. 15.8 mL 0   Ginkgo Biloba (GINKOBA PO) Take 120 mg by mouth daily at 12 noon. With Ginseng '200mg'$      Omega-3 Fatty Acids (FISH OIL) 1000 MG CAPS 1 capsule Orally Once a day for 30 day(s)     amoxicillin-clavulanate (AUGMENTIN) 875-125 MG tablet Take 1 tablet by mouth every 12 (twelve) hours. 14 tablet 0   No facility-administered medications prior to visit.   Allergies  Allergen Reactions   Codeine     Severe unrelenting nausea     Objective:   Today's Vitals   04/25/22 1552  BP: 134/76  Pulse: 80  Temp: 98 F (36.7 C)  TempSrc: Temporal  SpO2: 96%  Weight: 236 lb (107 kg)  Height: '5\' 2"'$  (1.575 m)   Body mass index is 43.16 kg/m.   General: Well developed, well nourished. No acute distress. HEENT: Normocephalic, non-traumatic. PERRL, EOMI. Conjunctiva clear. External ears normal. EAC and TMs   normal bilaterally. Nose with swollen, pale turbinates and scant  rhinorrhea. No pain on percussion over the   sinuses. Mucous membranes moist. Oropharynx clear. Good dentition. Neck: Supple. No lymphadenopathy. No thyromegaly. Lungs: Clear to auscultation bilaterally. No wheezing, rales or rhonchi. Psych: Alert and oriented. Normal mood and affect.  Health Maintenance Due  Topic Date Due   FOOT EXAM  Never done   OPHTHALMOLOGY EXAM  Never done   Diabetic kidney evaluation - Urine ACR  Never done   Hepatitis C Screening  Never done   PAP SMEAR-Modifier  Never done   Zoster Vaccines- Shingrix (1 of 2) Never done   MAMMOGRAM  01/17/2021   HEMOGLOBIN A1C   02/09/2022     Assessment & Plan:   Problem List Items Addressed This Visit       Respiratory   Seasonal allergic rhinitis - Primary    Symptoms and exam are consistent with an allergy flare. I recommend she continue her Flonase spray. She should consider switching to a daily antihistamine with a decongestant (such as Zyrtec-D). I also recommend she use daily nasal saline washes.       Return if symptoms worsen or fail to improve.   Haydee Salter, MD

## 2022-04-25 NOTE — Assessment & Plan Note (Signed)
Symptoms and exam are consistent with an allergy flare. I recommend she continue her Flonase spray. She should consider switching to a daily antihistamine with a decongestant (such as Zyrtec-D). I also recommend she use daily nasal saline washes.

## 2022-05-18 ENCOUNTER — Other Ambulatory Visit: Payer: Self-pay | Admitting: Physician Assistant

## 2022-05-23 NOTE — Progress Notes (Signed)
Subjective:    Christina Poole is a 57 y.o. female and is here for a comprehensive physical exam.  HPI  Health Maintenance Due  Topic Date Due   OPHTHALMOLOGY EXAM  Never done   Diabetic kidney evaluation - Urine ACR  Never done   Hepatitis C Screening  Never done   PAP SMEAR-Modifier  Never done   MAMMOGRAM  01/17/2021   HEMOGLOBIN A1C  02/09/2022    Acute Concerns: None.  Chronic Issues: Hypertension Treated with Lotrel 5-20 mg daily. Blood pressure recordings at home are as follows: checks once or twice a month. Blood pressure normal today at 122/80. Denies chest pain, SOB, LE swelling.   Sleep Apnea Stable with CPAP.  Obesity Stopped going to weight-loss clinic due to difficulty with meal plan. She was not able to eat enough with provided meal options. Did not tolerate Wegovy-- caused nausea --and was not able to get refills. Still wants to lose weight.  Prediabetes  Not interested in taking diabetes medication if A1c worsens. HGBA1c at 6.2% on 08/10/21.  Denies leg swelling, hemiparesis, GI symptoms.  Health Maintenance: Immunizations -- UTD on flu, tetanus vaccines. Colonoscopy -- Last completed 04/25/21. Results showed non-bleeding hemorrhoids, congested, altered vascular, erythematous, eroded and inflamed mucosa at the ileocecal valve, proximal transverse colon, ascending colon. Examination otherwise normal. Pathology showed moderately active chronic colitis. Recommended repeat in 2033. Mammogram -- Last completed 01/19/20. No significant mammographic findings. Overdue since 2022. PAP -- Followed by Lynnell Chad. Bone Density -- N/A Diet -- Trying to eat healthy. Exercise -- Walking regularly.  UTD with dentist? - UTD UTD with eye doctor? - Has had an eye exam but unsure if diabetic exam was completed.  Weight history: Wt Readings from Last 10 Encounters:  05/30/22 228 lb (103.4 kg)  04/25/22 236 lb (107 kg)  11/16/21 210 lb (95.3 kg)  10/11/21  219 lb (99.3 kg)  09/21/21 214 lb (97.1 kg)  08/31/21 217 lb (98.4 kg)  08/10/21 211 lb (95.7 kg)  07/11/21 213 lb (96.6 kg)  06/14/21 213 lb (96.6 kg)  05/30/21 213 lb (96.6 kg)   Body mass index is 41.7 kg/m. No LMP recorded. Patient is postmenopausal.  Alcohol use:  reports no history of alcohol use.  Tobacco use:  Tobacco Use: Low Risk  (05/30/2022)   Patient History    Smoking Tobacco Use: Never    Smokeless Tobacco Use: Never    Passive Exposure: Not on file   Eligible for lung cancer screening? No     05/30/2022    9:31 AM  Depression screen PHQ 2/9  Decreased Interest 0  Down, Depressed, Hopeless 0  PHQ - 2 Score 0     Other providers/specialists: Patient Care Team: Inda Coke, [place] as PCP - General (Physician Assistant)    PMHx, SurgHx, SocialHx, Medications, and Allergies were reviewed in the Visit Navigator and updated as appropriate.   Past Medical History:  Diagnosis Date   Colitis    Edema of both lower extremities    Hypertension    Knee pain    Pericarditis 2007   Sarcoidosis    Sleep apnea    Vitamin D deficiency      Past Surgical History:  Procedure Laterality Date   APPENDECTOMY     CESAREAN SECTION     MYOMECTOMY       Family History  Problem Relation Age of Onset   Diabetes Mother    Colon cancer Father  in his 71s   High blood pressure Father    Alcoholism Father     Social History   Tobacco Use   Smoking status: Never   Smokeless tobacco: Never  Vaping Use   Vaping Use: Never used  Substance Use Topics   Alcohol use: No   Drug use: No    Review of Systems:   Review of Systems  Constitutional:  Negative for chills, fever, malaise/fatigue and weight loss.  HENT:  Negative for hearing loss, sinus pain and sore throat.   Respiratory:  Negative for cough, hemoptysis and shortness of breath.   Cardiovascular:  Negative for chest pain, palpitations, leg swelling and PND.  Gastrointestinal:  Negative for  abdominal pain, blood in stool, constipation, diarrhea, heartburn, nausea and vomiting.  Genitourinary:  Negative for dysuria, frequency and urgency.  Musculoskeletal:  Negative for back pain, myalgias and neck pain.  Skin:  Negative for itching and rash.  Neurological:  Negative for dizziness, tingling, seizures, weakness and headaches.  Endo/Heme/Allergies:  Negative for polydipsia.  Psychiatric/Behavioral:  Negative for depression. The patient is not nervous/anxious.     Objective:   BP 122/80 (BP Location: Left Arm, Patient Position: Sitting, Cuff Size: Large)   Pulse 72   Temp (!) 97.1 F (36.2 C) (Temporal)   Ht 5\' 2"  (1.575 m)   Wt 228 lb (103.4 kg)   SpO2 96%   BMI 41.70 kg/m  Body mass index is 41.7 kg/m.   General Appearance:    Alert, cooperative, no distress, appears stated age  Head:    Normocephalic, without obvious abnormality, atraumatic  Eyes:    PERRL, conjunctiva/corneas clear, EOM's intact, fundi    benign, both eyes  Ears:    Normal TM's and external ear canals, both ears  Nose:   Nares normal, septum midline, mucosa normal, no drainage    or sinus tenderness  Throat:   Lips, mucosa, and tongue normal; teeth and gums normal  Neck:   Supple, symmetrical, trachea midline, no adenopathy;    thyroid:  no enlargement/tenderness/nodules; no carotid   bruit or JVD  Back:     Symmetric, no curvature, ROM normal, no CVA tenderness  Lungs:     Clear to auscultation bilaterally, respirations unlabored  Chest Wall:    No tenderness or deformity   Heart:    Regular rate and rhythm, S1 and S2 normal, no murmur, rub or gallop  Breast Exam:    Deferred  Abdomen:     Soft, non-tender, bowel sounds active all four quadrants,    no masses, no organomegaly  Genitalia:    Deferred  Extremities:   Extremities normal, atraumatic, no cyanosis or edema  Pulses:   2+ and symmetric all extremities  Skin:   Skin color, texture, turgor normal, no rashes or lesions  Lymph nodes:    Cervical, supraclavicular, and axillary nodes normal  Neurologic:   CNII-XII intact, normal strength, sensation and reflexes    throughout    Assessment/Plan:   Routine physical examination Today patient counseled on age appropriate routine health concerns for screening and prevention, each reviewed and up to date or declined. Immunizations reviewed and up to date or declined. Labs ordered and reviewed. Risk factors for depression reviewed and negative. Hearing function and visual acuity are intact. ADLs screened and addressed as needed. Functional ability and level of safety reviewed and appropriate. Education, counseling and referrals performed based on assessed risks today. Patient provided with a copy of personalized plan for preventive  services.  Type 2 diabetes mellitus without complication, without long-term current use of insulin Update A1c She declines any medications if levels are uncontrolled Discussed need to start exercising and cleaning up diet Follow-up based on results  Essential hypertension Normotensive Continue amlodopine-benazepril 5-20 mg daily Continue home monitoring and follow-up as needed if numbers >140/90  Hyperlipidemia associated with type 2 diabetes mellitus Update lipid panel and make recommendations accordingly  Obesity Continue efforts at exercising and healthy eating  Encounter for screening for other viral diseases Update Hep C and provide recommendations   I,Alexander Ruley,acting as a scribe for Inda Coke, PA.,have documented all relevant documentation on the behalf of Inda Coke, PA,as directed by  Inda Coke, PA while in the presence of Inda Coke, [place].   I, Inda Coke, [place], have reviewed all documentation for this visit. The documentation on 05/30/22 for the exam, diagnosis, procedures, and orders are all accurate and complete.   Inda Coke, PA-C [place]

## 2022-05-30 ENCOUNTER — Encounter: Payer: Self-pay | Admitting: Physician Assistant

## 2022-05-30 ENCOUNTER — Ambulatory Visit (INDEPENDENT_AMBULATORY_CARE_PROVIDER_SITE_OTHER): Payer: BC Managed Care – PPO | Admitting: Physician Assistant

## 2022-05-30 VITALS — BP 122/80 | HR 72 | Temp 97.1°F | Ht 62.0 in | Wt 228.0 lb

## 2022-05-30 DIAGNOSIS — E785 Hyperlipidemia, unspecified: Secondary | ICD-10-CM

## 2022-05-30 DIAGNOSIS — Z0000 Encounter for general adult medical examination without abnormal findings: Secondary | ICD-10-CM

## 2022-05-30 DIAGNOSIS — I10 Essential (primary) hypertension: Secondary | ICD-10-CM | POA: Diagnosis not present

## 2022-05-30 DIAGNOSIS — E119 Type 2 diabetes mellitus without complications: Secondary | ICD-10-CM

## 2022-05-30 DIAGNOSIS — E1169 Type 2 diabetes mellitus with other specified complication: Secondary | ICD-10-CM | POA: Diagnosis not present

## 2022-05-30 DIAGNOSIS — Z1159 Encounter for screening for other viral diseases: Secondary | ICD-10-CM

## 2022-05-30 LAB — COMPREHENSIVE METABOLIC PANEL
ALT: 17 U/L (ref 0–35)
AST: 18 U/L (ref 0–37)
Albumin: 3.7 g/dL (ref 3.5–5.2)
Alkaline Phosphatase: 60 U/L (ref 39–117)
BUN: 13 mg/dL (ref 6–23)
CO2: 28 mEq/L (ref 19–32)
Calcium: 8.9 mg/dL (ref 8.4–10.5)
Chloride: 110 mEq/L (ref 96–112)
Creatinine, Ser: 0.9 mg/dL (ref 0.40–1.20)
GFR: 72.11 mL/min (ref >60.00)
Glucose, Bld: 118 mg/dL — ABNORMAL HIGH (ref 70–99)
Potassium: 4.1 mEq/L (ref 3.5–5.1)
Sodium: 141 mEq/L (ref 135–145)
Total Bilirubin: 0.4 mg/dL (ref 0.2–1.2)
Total Protein: 6.2 g/dL (ref 6.0–8.3)

## 2022-05-30 LAB — CBC WITH DIFFERENTIAL/PLATELET
Basophils Absolute: 0.1 10*3/uL (ref 0.0–0.1)
Basophils Relative: 0.7 % (ref 0.0–3.0)
Eosinophils Absolute: 1.1 10*3/uL — ABNORMAL HIGH (ref 0.0–0.7)
Eosinophils Relative: 10.4 % — ABNORMAL HIGH (ref 0.0–5.0)
HCT: 38.4 % (ref 36.0–46.0)
Hemoglobin: 12.6 g/dL (ref 12.0–15.0)
Lymphocytes Relative: 23.9 % (ref 12.0–46.0)
Lymphs Abs: 2.6 10*3/uL (ref 0.7–4.0)
MCHC: 32.8 g/dL (ref 30.0–36.0)
MCV: 92.1 fl (ref 78.0–100.0)
Monocytes Absolute: 0.5 10*3/uL (ref 0.1–1.0)
Monocytes Relative: 4.5 % (ref 3.0–12.0)
Neutro Abs: 6.5 10*3/uL (ref 1.4–7.7)
Neutrophils Relative %: 60.5 % (ref 43.0–77.0)
Platelets: 286 10*3/uL (ref 150.0–400.0)
RBC: 4.17 Mil/uL (ref 3.87–5.11)
RDW: 14 % (ref 11.5–15.5)
WBC: 10.8 10*3/uL — ABNORMAL HIGH (ref 4.0–10.5)

## 2022-05-30 LAB — HEMOGLOBIN A1C: Hgb A1c MFr Bld: 6.8 % — ABNORMAL HIGH (ref 4.6–6.5)

## 2022-05-30 LAB — LIPID PANEL
Cholesterol: 196 mg/dL (ref 0–200)
HDL: 43.3 mg/dL (ref >39.00)
LDL Cholesterol: 120 mg/dL — ABNORMAL HIGH (ref 0–99)
NonHDL: 153.19
Total CHOL/HDL Ratio: 5
Triglycerides: 164 mg/dL — ABNORMAL HIGH (ref 0.0–149.0)
VLDL: 32.8 mg/dL (ref 0.0–40.0)

## 2022-05-30 NOTE — Patient Instructions (Signed)
It was great to see you! ? ?Please go to the lab for blood work.  ? ?Our office will call you with your results unless you have chosen to receive results via MyChart. ? ?If your blood work is normal we will follow-up each year for physicals and as scheduled for chronic medical problems. ? ?If anything is abnormal we will treat accordingly and get you in for a follow-up. ? ?Take care, ? ?Saatvik Thielman ?  ? ? ?

## 2022-05-31 LAB — MICROALBUMIN / CREATININE URINE RATIO
Creatinine,U: 116.8 mg/dL
Microalb Creat Ratio: 0.6 mg/g (ref 0.0–30.0)
Microalb, Ur: <0.7 mg/dL (ref 0.0–1.9)

## 2022-05-31 LAB — HEPATITIS C ANTIBODY: Hepatitis C Ab: NONREACTIVE

## 2022-06-11 ENCOUNTER — Encounter: Payer: Self-pay | Admitting: Physician Assistant

## 2022-06-12 ENCOUNTER — Encounter: Payer: Self-pay | Admitting: *Deleted

## 2022-08-14 ENCOUNTER — Other Ambulatory Visit: Payer: Self-pay | Admitting: Physician Assistant

## 2022-08-16 NOTE — Telephone Encounter (Signed)
Patient returning call. States she has seen results and would like to speak with someone clinical about these.

## 2022-08-16 NOTE — Telephone Encounter (Signed)
Called and spoke w/ she had review what Sam had written her however she never reply. She was calling to let know that she didn't want try medication at this time , and she will wait until Aug. To have her levels recheck.

## 2022-09-18 ENCOUNTER — Telehealth: Payer: Self-pay | Admitting: Physician Assistant

## 2022-09-18 ENCOUNTER — Encounter: Payer: Self-pay | Admitting: Physician Assistant

## 2022-09-18 ENCOUNTER — Telehealth: Payer: BC Managed Care – PPO | Admitting: Physician Assistant

## 2022-09-18 DIAGNOSIS — M25473 Effusion, unspecified ankle: Secondary | ICD-10-CM | POA: Diagnosis not present

## 2022-09-18 DIAGNOSIS — U071 COVID-19: Secondary | ICD-10-CM

## 2022-09-18 NOTE — Progress Notes (Signed)
Virtual Visit via Video   I connected with Christina Poole on 09/18/22 at 11:20 AM EDT by a video enabled telemedicine application and verified that I am speaking with the correct person using two identifiers. Location patient: Home Location provider: [facility], Office Persons participating in the virtual visit: Versa M Bartles, Jarold Motto PA-C  I discussed the limitations of evaluation and management by telemedicine and the availability of in person appointments. The patient expressed understanding and agreed to proceed.   Subjective:   HPI:   COVID-19 Positive Symptom onset: Saturday Travel/contacts: working a school summer camp  Vaccination status: UTD Testing results: positive on Saturday Patient endorses the following symptoms: headache(s), sinus congestion Patient denies the following symptoms: chest pain, SOB Treatments tried: theraflu, tylenol  Ankle swelling Was noticed by her plasma center -- was told she cannot donate until cleared by [place] She has started working on weight loss and has had improvement of this since that time Denies shortness of breath She feels like it can be mostly related to salt intake and amlodipine but is improving Denies leg swelling at the moment   Obesity She is wanting to work on using high deductible savings account money for a total gym Has been losing weight successfully with intermittent fasting; 18:6 Has a form that we need to complete for her high deductible savings account Denies concerns at this time  Patient Active Problem List   Diagnosis Date Noted   Seasonal allergic rhinitis 04/25/2022   Type 2 diabetes mellitus without complication, without long-term current use of insulin ([facility]) 09/02/2021   Hyperlipidemia associated with type 2 diabetes mellitus ([facility]) 09/02/2021   Prediabetes 03/21/2021   Class 2 obesity due to excess calories with body mass index (BMI) of 39.0 to 39.9 in adult 03/12/2021   Abdominal pain  10/28/2020   Omental infarction ([facility]) 10/27/2020   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   Pain of left hand 03/26/2019   Pain in left knee 09/26/2016   Colitis 09/17/2016   Obstructive sleep apnea 09/17/2016   Sarcoidosis 09/07/2016   Hypercalcemia 09/07/2016   Essential hypertension 08/13/2015   LVH (left ventricular hypertrophy) 08/13/2015   Hyperlipidemia 04/02/2015    Social History   Tobacco Use   Smoking status: Never   Smokeless tobacco: Never  Substance Use Topics   Alcohol use: No    Current Outpatient Medications:    amLODipine-benazepril (LOTREL) 5-20 MG capsule, TAKE 1 CAPSULE BY MOUTH EVERY DAY, Disp: 90 capsule, Rfl: 0   cholecalciferol (VITAMIN D) 1000 units tablet, Take 2,000 Units by mouth daily., Disp: , Rfl:    fluticasone (FLONASE) 50 MCG/ACT nasal spray, Place 1 spray into both nostrils daily., Disp: 15.8 mL, Rfl: 0   Ginkgo Biloba (GINKOBA PO), Take 120 mg by mouth daily at 12 noon. With Ginseng 200mg , Disp: , Rfl:    Omega-3 Fatty Acids (FISH OIL) 1000 MG CAPS, 1 capsule Orally Once a day for 30 day(s), Disp: , Rfl:   Allergies  Allergen Reactions   Codeine     Severe unrelenting nausea    Objective:   VITALS: Per patient if applicable, see vitals. GENERAL: Alert, appears well and in no acute distress. HEENT: Atraumatic, conjunctiva clear, no obvious abnormalities on inspection of external nose and ears. NECK: Normal movements of the head and neck. CARDIOPULMONARY: No increased WOB. Speaking in clear sentences. I:E ratio WNL.  MS: Moves all visible extremities without noticeable abnormality. PSYCH: Pleasant and cooperative, well-groomed. Speech normal rate and rhythm.  Affect is appropriate. Insight and judgement are appropriate. Attention is focused, linear, and appropriate.  NEURO: CN grossly intact. Oriented as arrived to appointment on time with no prompting. Moves both UE equally.  SKIN: No obvious lesions, wounds, erythema, or cyanosis  noted on face or hands.  Assessment and Plan:   Diagnoses and all orders for this visit:  COVID-19 No red flags on discussion, patient is not in any obvious distress during our visit. Discussed progression of most viral illnesses, and recommended supportive care at this point in time.  Discussed over the counter supportive care options, including Tylenol 500 mg q 8 hours, with recommendations to push fluids and rest. Reviewed return precautions including new/worsening fever, SOB, new/worsening cough, sudden onset changes of symptoms. Recommended need to self-quarantine and practice social distancing until symptoms resolve. I recommend that patient follow-up if symptoms worsen or persist despite treatment x 7-10 days, sooner if needed. I discussed the assessment and treatment plan with the patient. The patient was provided an opportunity to ask questions and all were answered. The patient agreed with the plan and demonstrated an understanding of the instructions. The patient was advised to call back or seek an in-person evaluation if the symptoms worsen or if the condition fails to improve as anticipated.  Ankle swelling, unspecified laterality Resolved at this time Continue to monitor Has in-person appointment with me next month, will evaluate and consider as needed diuretic if indicated If new symptom(s) such as shortness of breath, needs to let me know right away  Class 2 severe obesity due to excess calories with serious comorbidity in adult, unspecified BMI ([facility]) Continue efforts Will complete form if needed for high deductible savings account   [place], [place] 09/18/2022

## 2022-09-18 NOTE — Telephone Encounter (Signed)
Patient dropped off document medical necessity letter , to be filled out by provider. Patient requested to send it back via Call Patient to pick up within 5-days. Document is located in providers tray at front office.Please advise at Mobile [phone] (mobile)

## 2022-09-19 ENCOUNTER — Telehealth: Payer: Self-pay | Admitting: Physician Assistant

## 2022-09-19 NOTE — Telephone Encounter (Signed)
I did not call patient. Noted

## 2022-09-19 NOTE — Telephone Encounter (Signed)
Spoke to pt told her forms are completed and ready for pickup will put at the front desk. Pt verbalized understanding.

## 2022-09-19 NOTE — Telephone Encounter (Signed)
Patient states husband informed her that PCP office called requesting information on when her covid symptoms started. States Saturday she experienced a headache the entire day before nasal drip and cough began.

## 2022-09-20 NOTE — Telephone Encounter (Signed)
Pt would like a call back, she states the diagnosis code is missing on the form. Please advise.

## 2022-09-24 ENCOUNTER — Other Ambulatory Visit (HOSPITAL_COMMUNITY): Payer: Self-pay

## 2022-09-24 NOTE — Telephone Encounter (Signed)
Spoke to pt told her I do not have the form it was sent to scan. If you tell me the diagnosis I can give you the code. Pt verbalized understanding and said thinks it was Obesity. Told her Dx is E66.09. Pt verbalized understanding. Told her if she needs something else please let me know. Pt verbalized understanding.

## 2023-02-24 ENCOUNTER — Other Ambulatory Visit: Payer: Self-pay | Admitting: Physician Assistant

## 2023-04-17 LAB — HM MAMMOGRAPHY

## 2023-07-31 ENCOUNTER — Telehealth: Payer: Self-pay | Admitting: *Deleted

## 2023-07-31 ENCOUNTER — Ambulatory Visit: Admitting: Physician Assistant

## 2023-07-31 ENCOUNTER — Encounter: Payer: Self-pay | Admitting: Physician Assistant

## 2023-07-31 ENCOUNTER — Other Ambulatory Visit

## 2023-07-31 VITALS — BP 132/83 | HR 65 | Temp 97.9°F | Ht 62.0 in | Wt 232.0 lb

## 2023-07-31 DIAGNOSIS — H9193 Unspecified hearing loss, bilateral: Secondary | ICD-10-CM | POA: Diagnosis not present

## 2023-07-31 DIAGNOSIS — R051 Acute cough: Secondary | ICD-10-CM | POA: Diagnosis not present

## 2023-07-31 MED ORDER — AMOXICILLIN-POT CLAVULANATE 875-125 MG PO TABS: 1.0000 | ORAL_TABLET | Freq: Two times a day (BID) | ORAL | 0 refills | Status: DC

## 2023-07-31 NOTE — Patient Instructions (Signed)
 It was great to see you!  Start Augmentin   We will refer to audiology  Trial Debrox or other alternative at the store to help with earwax build-up    Let's follow-up in 4-6 weeks for Comprehensive Physical Exam (CPE) preventive care annual visit, sooner if you have concerns.  Take care,  Alexander Iba PA-C

## 2023-07-31 NOTE — Telephone Encounter (Signed)
 Copied from CRM [id number]. Topic: Appointments - Appointment Scheduling >> Jul 31, 2023 12:00 PM Oddis Bench wrote: Patient/patient representative is calling to schedule an appointment. Refer to attachments for appointment information.

## 2023-07-31 NOTE — Telephone Encounter (Signed)
 Appt today

## 2023-07-31 NOTE — Progress Notes (Signed)
 Christina Poole is a 58 y.o. female here for a new problem.  History of Present Illness:   Chief Complaint  Patient presents with   Sinus Problem    Pt c/o sinus issues x 2 weeks, clear nasal drainage, coughing and expectorating clear to light yellow. Denies fever or chills. Taking Dayquil, nose spray.   Hearing Loss    Pt would like a referral to Audiology    Hearing Loss: Pt complains of bilateral hearing loss.  Her family has encouraged her to get her ears evaluated and have a hearing test.  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  She describes a "fuzzy" feeling in her ears. No known trauma to cause hearing loss.   Sinus congestion: Pt complains of sinus congestion, nasal drainage (clear), and productive cough with clear-light yellow sputum. She recently did a telehealth visit while out of town and was prescribed a bromide nasal spray and tessalon perles  She did not tolerate the nasal spray well, stating it made her too dry.  Has also tried Dayquil and OTC nasal sprays Denies any fever, chill, shortness of breath, or current sore throat.   Pt is compliant with her Lotrel 5-20 mg daily, but notes she took her antihypertensive about an hour ago.   Past Medical History:  Diagnosis Date   Colitis    Edema of both lower extremities    Hypertension    Knee pain    Pericarditis 2007   Sarcoidosis    Sleep apnea    Vitamin D  deficiency      Social History   Tobacco Use   Smoking status: Never   Smokeless tobacco: Never  Vaping Use   Vaping status: Never Used  Substance Use Topics   Alcohol use: No   Drug use: No    Past Surgical History:  Procedure Laterality Date   APPENDECTOMY     CESAREAN SECTION     MYOMECTOMY      Family History  Problem Relation Age of Onset   Diabetes Mother    Colon cancer Father        in his 71s   High blood pressure Father    Alcoholism Father     Allergies  Allergen Reactions    Codeine     Severe unrelenting nausea    Current Medications:   Current Outpatient Medications:    amLODipine -benazepril  (LOTREL) 5-20 MG capsule, TAKE 1 CAPSULE BY MOUTH EVERY DAY, Disp: 90 capsule, Rfl: 0   amoxicillin -clavulanate (AUGMENTIN ) 875-125 MG tablet, Take 1 tablet by mouth 2 (two) times daily., Disp: 14 tablet, Rfl: 0   cholecalciferol (VITAMIN D ) 1000 units tablet, Take 2,000 Units by mouth daily., Disp: , Rfl:    Ginkgo Biloba (GINKOBA PO), Take 120 mg by mouth daily at 12 noon. With Ginseng 200mg , Disp: , Rfl:    ipratropium (ATROVENT ) 0.03 % nasal spray, Place 2 sprays into both nostrils 4 (four) times daily., Disp: , Rfl:    Omega-3 Fatty Acids (FISH OIL) 1000 MG CAPS, 1 capsule Orally Once a day for 30 day(s), Disp: , Rfl:    fluticasone  (FLONASE ) 50 MCG/ACT nasal spray, Place 1 spray into both nostrils daily. (Patient not taking: Reported on 07/31/2023), Disp: 15.8 mL, Rfl: 0   Review of Systems:   Negative unless otherwise specified per HPI.  Vitals:   Vitals:   07/31/23 1417 07/31/23 1440  BP: (!) 150/90 132/83  Pulse: 65   Temp:  97.9 F (36.6 C)   TempSrc: Temporal   SpO2: 96%   Weight: 232 lb (105.2 kg)   Height: 5\' 2"  (1.575 m)      Body mass index is 42.43 kg/m.  Physical Exam:   Physical Exam Vitals and nursing note reviewed.  Constitutional:      General: She is not in acute distress.    Appearance: She is well-developed. She is not ill-appearing or toxic-appearing.  HENT:     Head: Normocephalic and atraumatic.     Right Ear: Ear canal and external ear normal. A middle ear effusion is present. Tympanic membrane is not erythematous, retracted or bulging.     Left Ear: Tympanic membrane, ear canal and external ear normal. There is impacted cerumen.     Nose:     Right Turbinates: Enlarged.     Left Turbinates: Enlarged.     Right Sinus: Frontal sinus tenderness present. No maxillary sinus tenderness.     Left Sinus: Frontal sinus tenderness  present. No maxillary sinus tenderness.     Mouth/Throat:     Pharynx: Uvula midline. No posterior oropharyngeal erythema.  Eyes:     General: Lids are normal.     Conjunctiva/sclera: Conjunctivae normal.  Neck:     Trachea: Trachea normal.  Cardiovascular:     Rate and Rhythm: Normal rate and regular rhythm.     Heart sounds: Normal heart sounds, S1 normal and S2 normal.  Pulmonary:     Effort: Pulmonary effort is normal.     Breath sounds: Normal breath sounds. No decreased breath sounds, wheezing, rhonchi or rales.  Lymphadenopathy:     Cervical: No cervical adenopathy.  Skin:    General: Skin is warm and dry.  Neurological:     Mental Status: She is alert.  Psychiatric:        Speech: Speech normal.        Behavior: Behavior normal. Behavior is cooperative.     Assessment and Plan:   1. Acute cough (Primary) No red flags on exam.   Will initiate augmentin  per orders.  Discussed taking medications as prescribed.  Reviewed return precautions including new or worsening fever, SOB, new or worsening cough or other concerns.  Push fluids and rest.  I recommend that patient follow-up if symptoms worsen or persist despite treatment x 7-10 days, sooner if needed.  2. Bilateral hearing loss, unspecified hearing loss type - Ambulatory referral to Audiology    I, Bernita Bristle, acting as a scribe for Christina Poole, [place]., have documented all relevant documentation on the behalf of Christina Poole, [place], as directed by   while in the presence of Christina Poole, [place].  I, Christina Poole, [place], have reviewed all documentation for this visit. The documentation on 07/31/23 for the exam, diagnosis, procedures, and orders are all accurate and complete.  Christina Iba, PA-C

## 2023-08-27 ENCOUNTER — Ambulatory Visit: Attending: Physician Assistant | Admitting: Audiologist

## 2023-08-27 DIAGNOSIS — H9192 Unspecified hearing loss, left ear: Secondary | ICD-10-CM | POA: Diagnosis present

## 2023-08-27 DIAGNOSIS — H9202 Otalgia, left ear: Secondary | ICD-10-CM | POA: Insufficient documentation

## 2023-08-27 NOTE — Procedures (Signed)
  Outpatient Audiology and [facility] [address] [address] [place], [place]  [address] [phone]  AUDIOLOGICAL  EVALUATION  NAME: Christina Poole     DOB:   1965/09/08      MRN: [id number]                                                                                     DATE: 08/27/2023     REFERENT: Job Lukes, PA STATUS: Outpatient DIAGNOSIS: Left Ear Hearing Loss    History: [name] was seen for an audiological evaluation due to an odd sensation in the left ear that she struggles to describe. It feels like a small fuzzy bug is traveling across her ear. She has been using Debrox to get the wax out of the left ear. She is not sure it has worked.  [name] denies pain, pressure, or tinnitus in the left ear or right ear.  [name] no history of hazardous noise exposure.  Medical history shows diabetes without long term use of insulin . No other relevant medical history.    Evaluation:  Otoscopy showed a clear view of the tympanic membranes, bilaterally. No cerumen in left ear.  Tympanometry results were consistent with normal middle ear function, bilaterally   Audiometric testing was completed using Conventional Audiometry techniques with insert earphones and supraural headphones. Test results are consistent with normal hearing bilaterally  except for 8kHz, asymmetry at [facility] only with left 25dB worse. Speech Recognition Thresholds were obtained at  25dB HL in the right ear and at 15dB HL in the left ear. Word Recognition Testing was completed at  40dB SL and [name] scored 100% in each ear.    Results:  The test results were reviewed with [name]. She has a slight asymmetric loss at [facility] in both ears with left ear worse. This does not require use of hearing aids. Due to the weird sensation in her left ear and slight asymmetric loss, recommend Otolaryngology evaluation to be cautious. [name] agreed.  Audiogram printed and provided to [name].      Recommendations: Recommend  referral to Otolaryngology due to slight asymmetry in hearing and discomfort in left ear.  Job Lukes, [place] please place referral to Otolaryngology provider for evaluation.   34 minutes spent testing and counseling on results.   If you have any questions please feel free to contact me at [phone].  Lauraine Ka Stalnaker Au.D.  Audiologist   08/27/2023  11:06 AM  Cc: Job Lukes, [place]

## 2023-09-13 ENCOUNTER — Ambulatory Visit: Payer: Self-pay | Admitting: Physician Assistant

## 2023-09-13 ENCOUNTER — Encounter: Payer: Self-pay | Admitting: Physician Assistant

## 2023-09-13 ENCOUNTER — Ambulatory Visit
Admission: RE | Admit: 2023-09-13 | Discharge: 2023-09-13 | Disposition: A | Discharge status: Home / Self Care | Source: Ambulatory Visit | Attending: Physician Assistant | Admitting: Physician Assistant

## 2023-09-13 ENCOUNTER — Other Ambulatory Visit: Payer: Self-pay | Admitting: Physician Assistant

## 2023-09-13 ENCOUNTER — Ambulatory Visit (INDEPENDENT_AMBULATORY_CARE_PROVIDER_SITE_OTHER): Admitting: Physician Assistant

## 2023-09-13 VITALS — BP 136/80 | HR 75 | Temp 98.1°F | Ht 62.0 in | Wt 228.0 lb

## 2023-09-13 DIAGNOSIS — H9202 Otalgia, left ear: Secondary | ICD-10-CM

## 2023-09-13 DIAGNOSIS — E119 Type 2 diabetes mellitus without complications: Secondary | ICD-10-CM

## 2023-09-13 DIAGNOSIS — I10 Essential (primary) hypertension: Secondary | ICD-10-CM

## 2023-09-13 DIAGNOSIS — Z0000 Encounter for general adult medical examination without abnormal findings: Secondary | ICD-10-CM | POA: Diagnosis not present

## 2023-09-13 DIAGNOSIS — E66813 Obesity, class 3: Secondary | ICD-10-CM | POA: Diagnosis not present

## 2023-09-13 DIAGNOSIS — K529 Noninfective gastroenteritis and colitis, unspecified: Secondary | ICD-10-CM | POA: Insufficient documentation

## 2023-09-13 DIAGNOSIS — E88819 Insulin resistance, unspecified: Secondary | ICD-10-CM | POA: Diagnosis not present

## 2023-09-13 DIAGNOSIS — R21 Rash and other nonspecific skin eruption: Secondary | ICD-10-CM

## 2023-09-13 DIAGNOSIS — D869 Sarcoidosis, unspecified: Secondary | ICD-10-CM

## 2023-09-13 DIAGNOSIS — N852 Hypertrophy of uterus: Secondary | ICD-10-CM

## 2023-09-13 DIAGNOSIS — E785 Hyperlipidemia, unspecified: Secondary | ICD-10-CM | POA: Diagnosis not present

## 2023-09-13 DIAGNOSIS — E1169 Type 2 diabetes mellitus with other specified complication: Secondary | ICD-10-CM

## 2023-09-13 DIAGNOSIS — L52 Erythema nodosum: Secondary | ICD-10-CM | POA: Insufficient documentation

## 2023-09-13 LAB — MICROALBUMIN / CREATININE URINE RATIO
Creatinine,U: 156.3 mg/dL
Microalb Creat Ratio: 20.5 mg/g (ref 0.0–30.0)
Microalb, Ur: 3.2 mg/dL — ABNORMAL HIGH (ref 0.0–1.9)

## 2023-09-13 LAB — LIPID PANEL
Cholesterol: 210 mg/dL — ABNORMAL HIGH (ref 0–200)
HDL: 43.6 mg/dL (ref >39.00)
LDL Cholesterol: 150 mg/dL — ABNORMAL HIGH (ref 0–99)
NonHDL: 166.8
Total CHOL/HDL Ratio: 5
Triglycerides: 86 mg/dL (ref 0.0–149.0)
VLDL: 17.2 mg/dL (ref 0.0–40.0)

## 2023-09-13 LAB — CBC WITH DIFFERENTIAL/PLATELET
Basophils Absolute: 0 K/uL (ref 0.0–0.1)
Basophils Relative: 0.5 % (ref 0.0–3.0)
Eosinophils Absolute: 0.9 K/uL — ABNORMAL HIGH (ref 0.0–0.7)
Eosinophils Relative: 9.8 % — ABNORMAL HIGH (ref 0.0–5.0)
HCT: 38.4 % (ref 36.0–46.0)
Hemoglobin: 12.5 g/dL (ref 12.0–15.0)
Lymphocytes Relative: 27.5 % (ref 12.0–46.0)
Lymphs Abs: 2.5 K/uL (ref 0.7–4.0)
MCHC: 32.6 g/dL (ref 30.0–36.0)
MCV: 90.6 fl (ref 78.0–100.0)
Monocytes Absolute: 0.5 K/uL (ref 0.1–1.0)
Monocytes Relative: 5 % (ref 3.0–12.0)
Neutro Abs: 5.2 K/uL (ref 1.4–7.7)
Neutrophils Relative %: 57.2 % (ref 43.0–77.0)
Platelets: 311 K/uL (ref 150.0–400.0)
RBC: 4.23 Mil/uL (ref 3.87–5.11)
RDW: 13.8 % (ref 11.5–15.5)
WBC: 9.1 K/uL (ref 4.0–10.5)

## 2023-09-13 LAB — COMPREHENSIVE METABOLIC PANEL WITH GFR
ALT: 13 U/L (ref 0–35)
AST: 16 U/L (ref 0–37)
Albumin: 4.2 g/dL (ref 3.5–5.2)
Alkaline Phosphatase: 69 U/L (ref 39–117)
BUN: 13 mg/dL (ref 6–23)
CO2: 27 meq/L (ref 19–32)
Calcium: 9.4 mg/dL (ref 8.4–10.5)
Chloride: 106 meq/L (ref 96–112)
Creatinine, Ser: 0.84 mg/dL (ref 0.40–1.20)
GFR: 77.63 mL/min (ref >60.00)
Glucose, Bld: 114 mg/dL — ABNORMAL HIGH (ref 70–99)
Potassium: 4 meq/L (ref 3.5–5.1)
Sodium: 140 meq/L (ref 135–145)
Total Bilirubin: 0.5 mg/dL (ref 0.2–1.2)
Total Protein: 7.8 g/dL (ref 6.0–8.3)

## 2023-09-13 LAB — HEMOGLOBIN A1C: Hgb A1c MFr Bld: 7.2 % — ABNORMAL HIGH (ref 4.6–6.5)

## 2023-09-13 MED ORDER — AMLODIPINE BESY-BENAZEPRIL HCL 5-20 MG PO CAPS: 1.0000 | ORAL_CAPSULE | Freq: Every day | ORAL | 1 refills | Status: AC

## 2023-09-13 NOTE — Progress Notes (Signed)
 Subjective:    Christina Poole is a 58 y.o. female and is here for a comprehensive physical exam.  HPI  Health Maintenance Due  Topic Date Due   Diabetic kidney evaluation - Urine ACR  Never done   HEMOGLOBIN A1C  11/29/2022   Diabetic kidney evaluation - eGFR measurement  05/30/2023    Acute Concerns: Skin lesion She complains of a skin lesion located beneath her lower lip.  Denies following up with a dermatologist for this.  She is agreeable for a referral at this time.   Abnormal feeling in left ear Patient continues to complains of a mild asymmetrical hearing loss in the left ear accompanied by an odd sensation.  Was seen by audiology for this earlier this month who recommended ENT.  She is requesting an ENT referral at this time. Denies any ear pain.    Possible uterine Fibroids  She complains of unexplained weight gain and feeling bloated around her abdominal region.  Patient is menopausal.   Denies any abdominal pains or increased urine frequency.  Chronic Issues: Hypertension Patient denies compliance of Lotrel 5-20 mg daily. She states that she often experiences some leg swelling a mild cough with it and she would rather be off medication at this time.  She is not interested in starting any medications at this time.   Obesity // Insulin  Resistance Patient is not interested in medical interventions at this time.  Patient states she is not within the diabetic range. Will recheck this today.   Sarcoidosis Has not consistently seen pulmonary for ongoing management since 2019 Reports that she sees rheumatology yearly but was told to return as needed  She reports there is no concerns with any inflammatory arthritis  Health Maintenance: Immunizations -- UTD, patient refused her pneumonia vaccine today  Colonoscopy -- last done on 04/25/21. Due in 2033.  Mammogram -- Last done on 04/17/23. Normal  PAP -- last done on 04/13/22. Normal  Bone Density -- N/P Diet --  follows a typical diet Exercise -- typical exercise levels   Sleep habits -- no complains Mood -- stable  UTD with dentist? - yes UTD with eye doctor? - yes  Weight history: Wt Readings from Last 10 Encounters:  09/13/23 228 lb (103.4 kg)  07/31/23 232 lb (105.2 kg)  05/30/22 228 lb (103.4 kg)  04/25/22 236 lb (107 kg)  11/16/21 210 lb (95.3 kg)  10/11/21 219 lb (99.3 kg)  09/21/21 214 lb (97.1 kg)  08/31/21 217 lb (98.4 kg)  08/10/21 211 lb (95.7 kg)  07/11/21 213 lb (96.6 kg)   Body mass index is 41.7 kg/m. No LMP recorded. Patient is postmenopausal.  Alcohol use:  reports no history of alcohol use.  Tobacco use:  Tobacco Use: Low Risk  (09/13/2023)   Patient History    Smoking Tobacco Use: Never    Smokeless Tobacco Use: Never    Passive Exposure: Not on file   Eligible for lung cancer screening? no     09/13/2023    8:09 AM  Depression screen PHQ 2/9  Decreased Interest 0  Down, Depressed, Hopeless 0  PHQ - 2 Score 0     Other providers/specialists: Patient Care Team: Job Lukes, [place] as PCP - General (Physician Assistant)    PMHx, SurgHx, SocialHx, Medications, and Allergies were reviewed in the Visit Navigator and updated as appropriate.   Past Medical History:  Diagnosis Date   Colitis    Edema of both lower extremities  Hypertension    Knee pain    Pericarditis 2007   Sarcoidosis    Sleep apnea    Vitamin D  deficiency      Past Surgical History:  Procedure Laterality Date   APPENDECTOMY     CESAREAN SECTION     MYOMECTOMY       Family History  Problem Relation Age of Onset   Diabetes Mother    Colon cancer Father        in his 58s   High blood pressure Father    Alcoholism Father     Social History   Tobacco Use   Smoking status: Never   Smokeless tobacco: Never  Vaping Use   Vaping status: Never Used  Substance Use Topics   Alcohol use: No   Drug use: No    Review of Systems:   Review of Systems   Constitutional:  Negative for chills, fever, malaise/fatigue and weight loss.  HENT:  Negative for ear pain, hearing loss, sinus pain and sore throat.   Respiratory:  Negative for cough and hemoptysis.   Cardiovascular:  Negative for chest pain, palpitations, leg swelling and PND.  Gastrointestinal:  Negative for abdominal pain, constipation, diarrhea, heartburn, nausea and vomiting.  Genitourinary:  Negative for dysuria, frequency and urgency.  Musculoskeletal:  Negative for back pain, myalgias and neck pain.  Skin:  Positive for rash. Negative for itching.  Neurological:  Negative for dizziness, tingling, seizures and headaches.  Endo/Heme/Allergies:  Negative for polydipsia.  Psychiatric/Behavioral:  Negative for depression. The patient is not nervous/anxious.     Objective:   BP 136/80 (BP Location: Left Arm, Patient Position: Sitting, Cuff Size: Large)   Pulse 75   Temp 98.1 F (36.7 C) (Temporal)   Ht 5' 2 (1.575 m)   Wt 228 lb (103.4 kg)   SpO2 97%   BMI 41.70 kg/m  Body mass index is 41.7 kg/m.   General Appearance:    Alert, cooperative, no distress, appears stated age  Head:    Normocephalic, without obvious abnormality, atraumatic  Eyes:    PERRL, conjunctiva/corneas clear, EOM's intact, fundi    benign, both eyes  Ears:    Normal TM's and external ear canals, both ears  Nose:   Nares normal, septum midline, mucosa normal, no drainage    or sinus tenderness  Throat:   Lips, mucosa, and tongue normal; teeth and gums normal  Neck:   Supple, symmetrical, trachea midline, no adenopathy;    thyroid:  no enlargement/tenderness/nodules; no carotid   bruit or JVD  Back:     Symmetric, no curvature, ROM normal, no CVA tenderness  Lungs:     Clear to auscultation bilaterally, respirations unlabored  Chest Wall:    No tenderness or deformity   Heart:    Regular rate and rhythm, S1 and S2 normal, no murmur, rub or gallop  Breast Exam:    Deferred  Abdomen:     Soft,  non-tender, bowel sounds active all four quadrants,    no masses, palpable uterus  Genitalia:    Deferred  Extremities:   Extremities normal, atraumatic, no cyanosis or edema  Pulses:   2+ and symmetric all extremities  Skin:   Skin color, texture, turgor normal Cluster of hyperpigmented raised lesions to chin  Lymph nodes:   Cervical, supraclavicular, and axillary nodes normal  Neurologic:   CNII-XII intact, normal strength, sensation and reflexes    throughout    Assessment/Plan:   Routine physical examination Today patient  counseled on age appropriate routine health concerns for screening and prevention, each reviewed and up to date or declined. Immunizations reviewed and up to date or declined. Labs ordered and reviewed. Risk factors for depression reviewed and negative. Hearing function and visual acuity are intact. ADLs screened and addressed as needed. Functional ability and level of safety reviewed and appropriate. Education, counseling and referrals performed based on assessed risks today. Patient provided with a copy of personalized plan for preventive services.  Essential hypertension Normotensive Discouraged her from stopping medication but discussed if she chooses to do so she should plan to monitor blood pressure closely - with goal of 130/80 or lower In the meantime, recommend continuation of lotrel 5-20 mg daily -- declined change in medication for better side effect(s) profile  Hyperlipidemia associated with type 2 diabetes mellitus (HCC) Update lipid panel and provide recommendations   Class 3 severe obesity in adult, unspecified BMI, unspecified obesity type, unspecified whether serious comorbidity present Continue efforts at healthy lifestyle  Insulin  resistance Update A1c and uacr Discussed that if Hemoglobin A1c is elevated > 6.5% this would be consistent with diabetes mellitus diagnosis   Bulky or enlarged uterus Will order imaging  Rash Refer to dermatology    Left ear pain Refer to ENT   Sarcoidosis Overdue for follow-up with pulmonary Continue close follow up with rheumatology recommendations   Lucie Buttner, PA-C  [facility] M Kadhim,acting as a Neurosurgeon for [employer], [place].,have documented all relevant documentation on the behalf of Lucie Buttner, PA,as directed by  Lucie Buttner, PA while in the presence of Lucie Buttner, [place].  I, Lucie Buttner, [place], have reviewed all documentation for this visit. The documentation on 09/13/23 for the exam, diagnosis, procedures, and orders are all accurate and complete.

## 2023-09-13 NOTE — Patient Instructions (Signed)
 It was great to see you!  I will place Ear Nose Throat referral and dermatology referral I will order ultrasound of your uterus/pelvic area  Your goal blood pressure should be around < 130/80, unless you are over 56 years old, your goal may be closer to 140-150/90. Please note if you have been given other goals from a cardiologist or other healthcare provider, please defer to their recommendations.  When preparing to take your blood pressure: Plan ahead. Don't smoke, drink caffeine or exercise within 30 minutes before taking your blood pressure. Empty your bladder. Don't take the measurement over clothes. Remove the clothing over the arm that will be used to measure blood pressure. You can use either arm unless otherwise told by a healthcare provider. Usually there is not a big difference between readings on them. Be still. Allow at least five minutes of quiet rest before measurements. Don't talk or use the phone. Sit correctly. Sit with your back straight and supported (on a dining chair, rather than a sofa). Your feet should be flat on the floor. Do not cross your legs. Support your arm on a flat surface. The middle of the cuff should be placed on the upper arm at heart level.  Measure at the same time of the day. Take multiple readings and record the results. Each time you measure, take two readings one minute apart. Record the results and bring in to your next office visit.  In order to know how well the medication is working, I would like you to take your readings 1-2 hours after taking your blood pressure medication if possible. Take your blood pressure measurements and record 2-3 days per week.  Please go to the lab for blood work.   Our office will call you with your results unless you have chosen to receive results via MyChart.  If your blood work is normal we will follow-up each year for physicals and as scheduled for chronic medical problems.  If anything is abnormal we will treat  accordingly and get you in for a follow-up.  Take care,  Kenesha Moshier

## 2023-09-23 ENCOUNTER — Institutional Professional Consult (permissible substitution) (INDEPENDENT_AMBULATORY_CARE_PROVIDER_SITE_OTHER): Admitting: Physician Assistant

## 2023-09-23 ENCOUNTER — Encounter (INDEPENDENT_AMBULATORY_CARE_PROVIDER_SITE_OTHER): Payer: Self-pay

## 2023-10-22 ENCOUNTER — Ambulatory Visit: Admitting: Dietician

## 2024-04-27 ENCOUNTER — Ambulatory Visit: Admitting: Physician Assistant

## 2024-09-18 ENCOUNTER — Encounter: Admitting: Physician Assistant
